# Patient Record
Sex: Male | Born: 1985 | Race: White | Hispanic: No | Marital: Married | Smoking: Former smoker
Health system: Southern US, Community
[De-identification: ages and names within clinical notes are randomized; demographics above are authoritative.]

## PROBLEM LIST (undated history)

## (undated) ENCOUNTER — Emergency Department (HOSPITAL_COMMUNITY): Admission: EM | Discharge status: Absent without leave | Payer: Managed Care, Other (non HMO)

## (undated) DIAGNOSIS — F419 Anxiety disorder, unspecified: Secondary | ICD-10-CM

## (undated) HISTORY — PX: NECK SURGERY: SHX720

---
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
## 2011-04-06 ENCOUNTER — Emergency Department: Payer: Self-pay | Admitting: Unknown Physician Specialty

## 2011-05-29 ENCOUNTER — Emergency Department: Payer: Self-pay | Admitting: Emergency Medicine

## 2012-02-11 ENCOUNTER — Emergency Department: Payer: Self-pay | Admitting: Emergency Medicine

## 2013-09-27 ENCOUNTER — Encounter (HOSPITAL_COMMUNITY): Payer: Self-pay | Admitting: Emergency Medicine

## 2013-09-27 ENCOUNTER — Emergency Department (HOSPITAL_COMMUNITY): Payer: Managed Care, Other (non HMO)

## 2013-09-27 ENCOUNTER — Emergency Department (HOSPITAL_COMMUNITY)
Admission: EM | Admit: 2013-09-27 | Discharge: 2013-09-27 | Disposition: A | Discharge status: Home / Self Care | Payer: Managed Care, Other (non HMO) | Attending: Emergency Medicine | Admitting: Emergency Medicine

## 2013-09-27 DIAGNOSIS — M549 Dorsalgia, unspecified: Secondary | ICD-10-CM | POA: Insufficient documentation

## 2013-09-27 DIAGNOSIS — Z87891 Personal history of nicotine dependence: Secondary | ICD-10-CM | POA: Diagnosis not present

## 2013-09-27 DIAGNOSIS — R110 Nausea: Secondary | ICD-10-CM | POA: Insufficient documentation

## 2013-09-27 DIAGNOSIS — M545 Low back pain, unspecified: Secondary | ICD-10-CM | POA: Diagnosis not present

## 2013-09-27 MED ORDER — KETOROLAC TROMETHAMINE 60 MG/2ML IM SOLN
60.0000 mg | Freq: Once | INTRAMUSCULAR | Status: AC
  Administered 2013-09-27: 60 mg via INTRAMUSCULAR
  Filled 2013-09-27: qty 2

## 2013-09-27 NOTE — ED Provider Notes (Signed)
CSN: [id number]     Arrival date & time 09/27/13  0703 History   First MD Initiated Contact with Patient 09/27/13 0710     Chief Complaint  Patient presents with  . Back Pain  . Nausea     (Consider location/radiation/quality/duration/timing/severity/associated sxs/prior Treatment) HPI  Chad Patton is a 28 y.o. male who developed low back pain. Last night without known trauma. The pain was worse this morning, so he could not go to work. He is able to walk. The pain is worse when getting up to move. He has a feeling of "feeling hot all over". He denies fever, chills, nausea, or vomiting loss of bowel or bladder continence. He denies prior problems like this. He works Holiday representative. There are no other known modifying factors.   History reviewed. No pertinent past medical history. History reviewed. No pertinent past surgical history. No family history on file. History  Substance Use Topics  . Smoking status: Former Games developer  . Smokeless tobacco: Not on file  . Alcohol Use: No    Review of Systems  All other systems reviewed and are negative.     Allergies  Review of patient's allergies indicates no known allergies.  Home Medications   Prior to Admission medications   Not on File   BP 130/72  Pulse 76  Temp(Src) 98.4 F (36.9 C) (Oral)  Resp 20  SpO2 99% Physical Exam  Nursing note and vitals reviewed. Constitutional: He is oriented to person, place, and time. He appears well-developed and well-nourished.  HENT:  Head: Normocephalic and atraumatic.  Right Ear: External ear normal.  Left Ear: External ear normal.  Eyes: Conjunctivae and EOM are normal. Pupils are equal, round, and reactive to light.  Neck: Normal range of motion and phonation normal. Neck supple.  Cardiovascular: Normal rate, regular rhythm, normal heart sounds and intact distal pulses.   Pulmonary/Chest: Effort normal and breath sounds normal. He exhibits no bony tenderness.  Abdominal: Soft. There  is no tenderness.  Musculoskeletal: Normal range of motion.  Midline lumbar tenderness, without deformity or step-off. Mild, right lateral lumbar tenderness, without swelling or deformity. Normal range of motion back. Normal gait, demonstrated .  Neurological: He is alert and oriented to person, place, and time. No cranial nerve deficit or sensory deficit. He exhibits normal muscle tone. Coordination normal.  Skin: Skin is warm, dry and intact.  Psychiatric: He has a normal mood and affect. His behavior is normal. Judgment and thought content normal.    ED Course  Procedures (including critical care time)  Medications  ketorolac (TORADOL) injection 60 mg (not administered)    Patient Vitals for the past 24 hrs:  BP Temp Temp src Pulse Resp SpO2  09/27/13 0713 130/72 mmHg 98.4 F (36.9 C) Oral 76 20 99 %    9:44 AM Reevaluation with update and discussion. After initial assessment and treatment, an updated evaluation reveals more comfortable now. Chad Patton    Labs Review Labs Reviewed - No data to display  Imaging Review Dg Lumbar Spine Complete  09/27/2013   CLINICAL DATA:  Low back pain  EXAM: LUMBAR SPINE - COMPLETE 4+ VIEW  COMPARISON:  None.  FINDINGS: There is no evidence of lumbar spine fracture. Alignment is normal. Intervertebral disc spaces are maintained.  IMPRESSION: Negative.   Electronically Signed   By: Marlan Palau M.D.   On: 09/27/2013 08:23     EKG Interpretation None      MDM   Final diagnoses:  Bilateral low  back pain without sciatica   LBP likely related to type of work, he is doing. I doubt fracture, radiculopathy, myelopathy.   Nursing Notes Reviewed/ Care Coordinated Applicable Imaging Reviewed Interpretation of Laboratory Data incorporated into ED treatment  The patient appears reasonably screened and/or stabilized for discharge and I doubt any other medical condition or other [facility] requiring further screening, evaluation, or treatment in  the ED at this time prior to discharge.  Plan: Home Medications- IBU; Home Treatments- ice/heat, exercises, work release for 3 days then light duty for 5 days; return here if the recommended treatment, does not improve the symptoms; Recommended follow up- PCP prn  Flint Melter, MD 09/27/13 [phone]

## 2013-09-27 NOTE — ED Notes (Signed)
Pt states that she began having lower back pain that started last night and has worsened. Pt states that when he sits he feels lightheaded, nauseated, hot and his back starts "throbbing". Pt denies any urinary symptoms or injury to back.

## 2013-09-27 NOTE — Discharge Instructions (Signed)
Use ice to the back for 2 days, and to use heat. As you enter the heat phase, do some gentle stretching exercises. Avoid heavy lifting, for one week.   Back Pain, Adult Low back pain is very common. About 1 in 5 people have back pain.The cause of low back pain is rarely dangerous. The pain often gets better over time.About half of people with a sudden onset of back pain feel better in just 2 weeks. About 8 in 10 people feel better by 6 weeks.  CAUSES Some common causes of back pain include:  Strain of the muscles or ligaments supporting the spine.  Wear and tear (degeneration) of the spinal discs.  Arthritis.  Direct injury to the back. DIAGNOSIS Most of the time, the direct cause of low back pain is not known.However, back pain can be treated effectively even when the exact cause of the pain is unknown.Answering your caregiver's questions about your overall health and symptoms is one of the most accurate ways to make sure the cause of your pain is not dangerous. If your caregiver needs more information, he or she may order lab work or imaging tests (X-rays or MRIs).However, even if imaging tests show changes in your back, this usually does not require surgery. HOME CARE INSTRUCTIONS For many people, back pain returns.Since low back pain is rarely dangerous, it is often a condition that people can learn to [facility] their own.   Remain active. It is stressful on the back to sit or stand in one place. Do not sit, drive, or stand in one place for more than 30 minutes at a time. Take short walks on level surfaces as soon as pain allows.Try to increase the length of time you walk each day.  Do not stay in bed.Resting more than 1 or 2 days can delay your recovery.  Do not avoid exercise or work.Your body is made to move.It is not dangerous to be active, even though your back may hurt.Your back will likely heal faster if you return to being active before your pain is gone.  Pay  attention to your body when you bend and lift. Many people have less discomfortwhen lifting if they bend their knees, keep the load close to their bodies,and avoid twisting. Often, the most comfortable positions are those that put less stress on your recovering back.  Find a comfortable position to sleep. Use a firm mattress and lie on your side with your knees slightly bent. If you lie on your back, put a pillow under your knees.  Only take over-the-counter or prescription medicines as directed by your caregiver. Over-the-counter medicines to reduce pain and inflammation are often the most helpful.Your caregiver may prescribe muscle relaxant drugs.These medicines help dull your pain so you can more quickly return to your normal activities and healthy exercise.  Put ice on the injured area.  Put ice in a plastic bag.  Place a towel between your skin and the bag.  Leave the ice on for 15-20 minutes, 03-04 times a day for the first 2 to 3 days. After that, ice and heat may be alternated to reduce pain and spasms.  Ask your caregiver about trying back exercises and gentle massage. This may be of some benefit.  Avoid feeling anxious or stressed.Stress increases muscle tension and can worsen back pain.It is important to recognize when you are anxious or stressed and learn ways to manage it.Exercise is a great option. SEEK MEDICAL CARE IF:  You have pain that  is not relieved with rest or medicine.  You have pain that does not improve in 1 week.  You have new symptoms.  You are generally not feeling well. SEEK IMMEDIATE MEDICAL CARE IF:   You have pain that radiates from your back into your legs.  You develop new bowel or bladder control problems.  You have unusual weakness or numbness in your arms or legs.  You develop nausea or vomiting.  You develop abdominal pain.  You feel faint. Document Released: 01/11/2005 Document Revised: 07/13/2011 Document Reviewed:  05/15/2013 [facility] Patient Information 2015 [place], [place]. This information is not intended to replace advice given to you by your health care provider. Make sure you discuss any questions you have with your health care provider.  Back Exercises Back exercises help treat and prevent back injuries. The goal of back exercises is to increase the strength of your abdominal and back muscles and the flexibility of your back. These exercises should be started when you no longer have back pain. Back exercises include:  Pelvic Tilt. Lie on your back with your knees bent. Tilt your pelvis until the lower part of your back is against the floor. Hold this position 5 to 10 sec and repeat 5 to 10 times.  Knee to Chest. Pull first 1 knee up against your chest and hold for 20 to 30 seconds, repeat this with the other knee, and then both knees. This may be done with the other leg straight or bent, whichever feels better.  Sit-Ups or Curl-Ups. Bend your knees 90 degrees. Start with tilting your pelvis, and do a partial, slow sit-up, lifting your trunk only 30 to 45 degrees off the floor. Take at least 2 to 3 seconds for each sit-up. Do not do sit-ups with your knees out straight. If partial sit-ups are difficult, simply do the above but with only tightening your abdominal muscles and holding it as directed.  Hip-Lift. Lie on your back with your knees flexed 90 degrees. Push down with your feet and shoulders as you raise your hips a couple inches off the floor; hold for 10 seconds, repeat 5 to 10 times.  Back arches. Lie on your stomach, propping yourself up on bent elbows. Slowly press on your hands, causing an arch in your low back. Repeat 3 to 5 times. Any initial stiffness and discomfort should lessen with repetition over time.  Shoulder-Lifts. Lie face down with arms beside your body. Keep hips and torso pressed to floor as you slowly lift your head and shoulders off the floor. Do not overdo your exercises,  especially in the beginning. Exercises may cause you some mild back discomfort which lasts for a few minutes; however, if the pain is more severe, or lasts for more than 15 minutes, do not continue exercises until you see your caregiver. Improvement with exercise therapy for back problems is slow.  See your caregivers for assistance with developing a proper back exercise program. Document Released: 02/19/2004 Document Revised: 04/05/2011 Document Reviewed: 11/12/2010 [facility] Patient Information 2015 [place], [place]. This information is not intended to replace advice given to you by your health care provider. Make sure you discuss any questions you have with your health care provider.

## 2013-09-27 NOTE — ED Notes (Signed)
Pt given crackers and drink 

## 2013-09-27 NOTE — ED Notes (Addendum)
MD at bedside. 

## 2014-02-13 ENCOUNTER — Encounter (HOSPITAL_COMMUNITY): Payer: Self-pay | Admitting: Emergency Medicine

## 2014-02-13 ENCOUNTER — Emergency Department (HOSPITAL_COMMUNITY)
Admission: EM | Admit: 2014-02-13 | Discharge: 2014-02-13 | Disposition: A | Discharge status: Home / Self Care | Payer: Managed Care, Other (non HMO) | Attending: Emergency Medicine | Admitting: Emergency Medicine

## 2014-02-13 DIAGNOSIS — K029 Dental caries, unspecified: Secondary | ICD-10-CM | POA: Diagnosis not present

## 2014-02-13 DIAGNOSIS — Z87891 Personal history of nicotine dependence: Secondary | ICD-10-CM | POA: Diagnosis not present

## 2014-02-13 DIAGNOSIS — K002 Abnormalities of size and form of teeth: Secondary | ICD-10-CM | POA: Diagnosis not present

## 2014-02-13 DIAGNOSIS — K088 Other specified disorders of teeth and supporting structures: Secondary | ICD-10-CM | POA: Insufficient documentation

## 2014-02-13 DIAGNOSIS — K0889 Other specified disorders of teeth and supporting structures: Secondary | ICD-10-CM

## 2014-02-13 MED ORDER — BUPIVACAINE-EPINEPHRINE (PF) 0.5% -1:200000 IJ SOLN
1.8000 mL | Freq: Once | INTRAMUSCULAR | Status: AC
  Administered 2014-02-13: 1.8 mL

## 2014-02-13 MED ORDER — OXYCODONE HCL 5 MG PO TABS
5.0000 mg | ORAL_TABLET | Freq: Once | ORAL | Status: AC
  Administered 2014-02-13: 5 mg via ORAL
  Filled 2014-02-13: qty 1

## 2014-02-13 MED ORDER — OXYCODONE-ACETAMINOPHEN 5-325 MG PO TABS: 2.0000 | ORAL_TABLET | ORAL | Status: DC | PRN

## 2014-02-13 MED ORDER — PENICILLIN V POTASSIUM 500 MG PO TABS: 500.0000 mg | ORAL_TABLET | Freq: Three times a day (TID) | ORAL | Status: DC

## 2014-02-13 NOTE — ED Provider Notes (Signed)
CSN: [id number]     Arrival date & time 02/13/14  2154 History  This chart was scribed for non-physician practitioner, Terri Piedra, PA-C,working with Raeford Razor, MD, by Karle Plumber, ED Scribe. This patient was seen in room TR06C/TR06C and the patient's care was started at 10:17 PM.  Chief Complaint  Patient presents with  . Dental Pain   The history is provided by the patient. No language interpreter was used.    HPI Comments:  Chad Patton is a 29 y.o. male who presents to the Emergency Department complaining of severe dental pain of the lower right-sided first molar that has been ongoing for the past two weeks. He states he saw his dentist two weeks ago and was given a prescription for Vicodin 5/500 mg (last dose about two hours ago) but he is now out. He denies being prescribed an antibiotic. Pt states he is supposed to have the tooth worked on but as not yet made the appt. Cold air and eating makes the pain worse. Denies alleviating factors. Denies fever, chills, nausea, vomiting, difficulty swallowing or opening the mouth, SOB or drooling. He has no known drug allergies. Past surgical h/o cervical spine surgery.  History reviewed. No pertinent past medical history. Past Surgical History  Procedure Laterality Date  . Neck surgery     No family history on file. History  Substance Use Topics  . Smoking status: Former Games developer  . Smokeless tobacco: Not on file  . Alcohol Use: No    Review of Systems  Constitutional: Negative for fever and chills.  HENT: Positive for dental problem. Negative for drooling and trouble swallowing.   Respiratory: Negative for shortness of breath.   Gastrointestinal: Negative for nausea and vomiting.  All other systems reviewed and are negative.   Allergies  Review of patient's allergies indicates no known allergies.  Home Medications   Prior to Admission medications   Medication Sig Start Date End Date Taking? Authorizing Provider   acetaminophen (TYLENOL) 325 MG tablet Take 650 mg by mouth every 6 (six) hours as needed for mild pain.    Historical Provider, MD  ibuprofen (ADVIL,MOTRIN) 200 MG tablet Take 400-600 mg by mouth every 6 (six) hours as needed for moderate pain.    Historical Provider, MD  oxyCODONE-acetaminophen (PERCOCET) 5-325 MG per tablet Take 2 tablets by mouth every 4 (four) hours as needed. 02/13/14   Shrika Milos A Forcucci, PA-C  penicillin v potassium (VEETID) 500 MG tablet Take 1 tablet (500 mg total) by mouth 3 (three) times daily. 02/13/14   Nkosi Cortright A Forcucci, PA-C   Triage Vitals: BP 138/91 mmHg  Pulse 68  Temp(Src) 97.6 F (36.4 C) (Oral)  Resp 16  SpO2 98% Physical Exam  Constitutional: He is oriented to person, place, and time. He appears well-developed and well-nourished.  HENT:  Head: Normocephalic and atraumatic.  Right Ear: Tympanic membrane, external ear and ear canal normal.  Left Ear: Tympanic membrane, external ear and ear canal normal.  Mouth/Throat: Uvula is midline, oropharynx is clear and moist and mucous membranes are normal. No oral lesions. No trismus in the jaw. Abnormal dentition. Dental caries present. No dental abscesses, uvula swelling or lacerations.    Eyes: EOM are normal.  Neck: Normal range of motion.  Cardiovascular: Normal rate.   Pulmonary/Chest: Effort normal.  Musculoskeletal: Normal range of motion.  Neurological: He is alert and oriented to person, place, and time.  Skin: Skin is warm and dry.  Psychiatric: He has a normal mood and  affect. His behavior is normal.  Nursing note and vitals reviewed.   ED Course  Dental Date/Time: 02/13/2014 10:46 PM Performed by: Terri PiedraFORCUCCI, Eliot Bencivenga A Authorized by: Terri PiedraFORCUCCI, Keshawna Dix A Consent: Verbal consent obtained. Risks and benefits: risks, benefits and alternatives were discussed Consent given by: patient Patient understanding: patient states understanding of the procedure being performed Patient consent: the  patient's understanding of the procedure matches consent given Procedure consent: procedure consent matches procedure scheduled Relevant documents: relevant documents present and verified Test results: test results available and properly labeled Site marked: the operative site was marked Imaging studies: imaging studies available Patient identity confirmed: verbally with patient Time out: Immediately prior to procedure a "time out" was called to verify the correct patient, procedure, equipment, support staff and site/side marked as required. Local anesthesia used: yes Anesthesia: local infiltration Local anesthetic: bupivacaine 0.5% with epinephrine Anesthetic total: 1.8 ml Patient sedated: no Patient tolerance: Patient tolerated the procedure well with no immediate complications   (including critical care time) DIAGNOSTIC STUDIES: Oxygen Saturation is 98% on RA, normal by my interpretation.   COORDINATION OF CARE: 10:24 PM- Will give pt dental block and prescribe antibiotic. Advised pt to follow up with his dentist in the morning. Pt verbalizes understanding and agrees to plan.  Medications  bupivacaine-epinephrine (MARCAINE W/ EPI) 0.5% -1:200000 injection 1.8 mL (1.8 mLs Infiltration Given by Other 02/13/14 2243)  oxyCODONE (Oxy IR/ROXICODONE) immediate release tablet 5 mg (5 mg Oral Given 02/13/14 2243)   Labs Review Labs Reviewed - No data to display  Imaging Review No results found.   EKG Interpretation None         MDM   Final diagnoses:  Pain, dental   A 29 year old male presents emergency room for evaluation of dental pain. Patient has Artie seen a dentist and is to have a plaque scaling and likely to have the tooth pulled. Patient is to call dentist in the morning. Patient given dental block here. There is no evidence for abscess, trismus, or Ludwig's angina at this time. We'll discharge home with Pen-Vee and we'll give short course of oxycodone. Patient follow-up  with dentist tomorrow. Patient to return for symptoms of Ludwig's angina or abscess. He states understanding and agreement at this time. Patient stable for discharge.  I personally performed the services described in this documentation, which was scribed in my presence. The recorded information has been reviewed and is accurate.    Eben Burowourtney A Forcucci, PA-C 02/13/14 2250  Raeford RazorStephen Kohut, MD 02/14/14 0000

## 2014-02-13 NOTE — ED Notes (Signed)
Pt arrives with dental pain x2 weeks, states he's been to the dentist and was given vicodin but ran out. Cannot recall if dentist states to get it pulled/root canal, states he needs something to get him through the night. No visible abcess.

## 2014-02-13 NOTE — Discharge Instructions (Signed)
Dental Pain °A tooth ache may be caused by cavities (tooth decay). Cavities expose the nerve of the tooth to air and hot or cold temperatures. It may come from an infection or abscess (also called a boil or furuncle) around your tooth. It is also often caused by dental caries (tooth decay). This causes the pain you are having. °DIAGNOSIS  °Your caregiver can diagnose this problem by exam. °TREATMENT  °· If caused by an infection, it may be treated with medications which kill germs (antibiotics) and pain medications as prescribed by your caregiver. Take medications as directed. °· Only take over-the-counter or prescription medicines for pain, discomfort, or fever as directed by your caregiver. °· Whether the tooth ache today is caused by infection or dental disease, you should see your dentist as soon as possible for further care. °SEEK MEDICAL CARE IF: °The exam and treatment you received today has been provided on an emergency basis only. This is not a substitute for complete medical or dental care. If your problem worsens or new problems (symptoms) appear, and you are unable to meet with your dentist, call or return to this location. °SEEK IMMEDIATE MEDICAL CARE IF:  °· You have a fever. °· You develop redness and swelling of your face, jaw, or neck. °· You are unable to open your mouth. °· You have severe pain uncontrolled by pain medicine. °MAKE SURE YOU:  °· Understand these instructions. °· Will watch your condition. °· Will get help right away if you are not doing well or get worse. °Document Released: 01/11/2005 Document Revised: 04/05/2011 Document Reviewed: 08/30/2007 °ExitCare® Patient Information ©2015 [employer]. This information is not intended to replace advice given to you by your health care provider. Make sure you discuss any questions you have with your health care provider. ° °Emergency Department Resource Guide °1) Find a Doctor and Pay Out of Pocket °Although you won't have to find out who  is covered by your insurance plan, it is a good idea to ask around and get recommendations. You will then need to call the office and see if the doctor you have chosen will accept you as a new patient and what types of options they offer for patients who are self-pay. Some doctors offer discounts or will set up payment plans for their patients who do not have insurance, but you will need to ask so you aren't surprised when you get to your appointment. ° °2) Contact Your Local Health Department °Not all health departments have doctors that can see patients for sick visits, but many do, so it is worth a call to see if yours does. If you don't know where your local health department is, you can check in your phone book. The CDC also has a tool to help you locate your state's health department, and many state websites also have listings of all of their local health departments. ° °3) Find a Walk-in Clinic °If your illness is not likely to be very severe or complicated, you may want to try a walk in clinic. These are popping up all over the country in pharmacies, drugstores, and shopping centers. They're usually staffed by nurse practitioners or physician assistants that have been trained to treat common illnesses and complaints. They're usually fairly quick and inexpensive. However, if you have serious medical issues or chronic medical problems, these are probably not your best option. ° °No Primary Care Doctor: °- Call [facility] at  832-8000 - they can help you locate a primary   care doctor that  accepts your insurance, provides certain services, etc. °- Physician Referral Service- 1-800-533-3463 ° °Chronic Pain Problems: °Organization         Address  Phone   Notes  °[facility]  (336) 297-2271 Patients need to be referred by their primary care doctor.  ° °Medication Assistance: °Organization         Address  Phone   Notes  °[place] [facility] [address]  [address] °[place], [place] [address] (336) 641-8030 --Must be a resident of [place] °-- Must have NO insurance coverage whatsoever (no Medicaid/ Medicare, etc.) °-- The pt. MUST have a primary care doctor that directs their care regularly and follows them in the community °  °[facility]  (866) 331-1348   °[employer]  (888) 892-1162   ° °Agencies that provide inexpensive medical care: °Organization         Address  Phone   Notes  °[facility]  (336) 832-8035   °[facility]    (336) 832-7272   °[facility] [address] °[place], [place] [address] (336) 832-4777   °[facility] [address], °[place] (336) 271-4999   °[facility]    (336) 373-0678   °[facility]    (336) 272-1050   °[facility] ° [address], [place] Phone:  (336) 832-4444, Fax:  (336) 832-4440 Hours of Operation:  9 am - 6 pm, M-F.  Also accepts Medicaid/Medicare and self-pay.  °[facility] ° [address], [place] Phone: (336) 832-3150, Fax: (336) 832-3151. Hours of Operation:  8:30 am - 5:30 pm, M-F.  Also accepts Medicaid and self-pay.  °[facility] [place] [address], [place] Phone: (336) 878-6027   °[facility] [address], [place], [place] (336)723-1848, Ext. 123 Mondays & Thursdays: 7-9 AM.  First 15 patients are seen on a first come, first serve basis. °  ° °Medicaid-accepting [place] Providers: ° °Organization         Address  Phone   Notes  °[facility] [address], [place] (336) 641-2100 Also accepts self-pay patients.  °[facility] [address], [place] ° (336) 856-9996   °[facility] [address], [place] (336) 288-8857   °[facility] [address] [place] [address], [place] (336) 299-7000   °Veita Bland [address], [place]  ° (336) 373-1557 Only accepts [place] Access Medicaid patients after they have their name applied to their card.  ° °Self-Pay (no insurance) in [place]: ° °Organization         Address  Phone   Notes  °Sickle Cell Patients, [facility] [address], [place] (336) 832-1970   °[facility] [address], [place] (336) 832-4400   °[facility] ° [address], [place] (336) 992-4800   °[facility]/Dr. Osei-Bonsu ° [address] [place] [address], [place] or [address], [place] (336) 841-8500 Phone number for both [place] and [place] locations is the same.  °[facility] [address], [place] (336) 299-0000   °[facility] [address] [place] [address],  or [address] (336) 852-7530 °(336) 878-2260   °  [facility] [address], [place] (336) 350-1642, phone; (336) 294-5005, fax Sees patients 1st and 3rd Saturday of every month.  Must not qualify for public or private insurance (i.e. Medicaid, Medicare, [place] Health Choice, Veterans' Benefits) • Household income should be no more than 200% of the poverty level •The clinic cannot treat you if you are pregnant or think you are pregnant • Sexually transmitted diseases are not treated at the clinic.  ° ° °Dental Care: °Organization         Address  Phone  Notes  °[facility] [facility] [address], [place] (336) 641-6152 Accepts children up to age 21 who are enrolled in Medicaid or [place] Health Choice; pregnant women with a Medicaid card; and children who have applied for Medicaid or [place] Health Choice, but were declined, whose parents can pay a reduced fee at time of service.  °[facility] [place]  [address], [place] (336) 641-7733 Accepts children up to age 21 who are  enrolled in Medicaid or [place] Health Choice; pregnant women with a Medicaid card; and children who have applied for Medicaid or [place] Health Choice, but were declined, whose parents can pay a reduced fee at time of service.  °[facility] ° [address], [place] (336) 641-4533 Patients are seen by appointment only. Walk-ins are not accepted. [facility] will see patients 18 years of age and older. °Monday - Tuesday (8am-5pm) °Most Wednesdays (8:30-5pm) °$30 per visit, cash only  °[facility] ° [address], [place] (336) 641-4533 Patients are seen by appointment only. Walk-ins are not accepted. [facility] will see patients 18 years of age and older. °One Wednesday Evening (Monthly: Volunteer Based).  $30 per visit, cash only  °[facility]  (919) 537-3737 for adults; Children under age 4, call Graduate Pediatric Dentistry at (919) [phone]. Children aged 4-14, please call (919) [phone] to request a pediatric application. ° Dental services are provided in all areas of dental care including fillings, crowns and bridges, complete and partial dentures, implants, gum treatment, root canals, and extractions. Preventive care is also provided. Treatment is provided to both adults and children. °Patients are selected via a lottery and there is often a waiting list. °  °[facility] [address], ° ° (336) 763-8833 [email or web address] °  °[facility] [address], [place], [place] (336)723-1848, Ext. 123 Second and Fourth Thursday of each month, opens at 6:30 AM; Clinic ends at 9 AM.  Patients are seen on a first-come first-served basis, and a limited number are seen during each clinic.  ° °[facility] ° [address], [place], [place] (336) 723-7904   Eligibility Requirements °You must have lived in [place], [place], or [place] counties for at least the last three months. °  You  cannot be eligible for state or federal sponsored healthcare insurance, including Veterans Administration, Medicaid, or Medicare. °  You generally cannot be eligible for healthcare insurance through your employer.  °  How to apply: °Eligibility screenings are held every Tuesday and Wednesday afternoon from 1:00 pm until 4:00 pm. You do not need an appointment for the interview!  °[facility] [address], [place], [place] 336-631-2330   °[facility] [facility]  336-342-8273   °[facility]  336-703-3100   °[facility]  336-570-6415   ° °Behavioral Health Resources in the Community: °Intensive Outpatient Programs °Organization         Address  Phone  Notes  °[facility] [address], [place], [place] 336-878-6098   °[facility] [address], [place], [place] 336-832-9800   °[facility] [address], [place], [place] ° 336-882-2125   °[facility] [address],  °[place], [place] 1-800-853-5163 or 336-641-4981   °Substance Abuse Resources °Organization         Address  Phone  Notes  °[facility]  336-882-2125   °[facility]  336-784-9470   °[facility]  336-285-9073   °[facility]  336-845-3988   °Residential & Outpatient Substance Abuse Program  1-800-659-3381   °Psychological Services °Organization         Address  Phone  Notes  °[facility]  336- 832-9600   °[facility]  336- 378-7881   °[facility] [address], [place] 1-800-853-5163 or 336-641-4981   ° °Mobile Crisis Teams °Organization         Address  Phone  Notes  °[facility], Mobile Crisis Care Unit  1-877-626-1772   °Assertive °[facility] ° [address] [place], [place] 336-834-9664   °Sharon DeEsch [address] °[place] 336-554-5454   ° °Self-Help/Support  Groups °Organization         Address  Phone             Notes  °[facility] - variety of support groups  336- 373-1402 Call for more information  °Narcotics Anonymous (NA), [facility] [address], °[place]  2 meetings at this location  ° °Residential Treatment Programs °Organization         Address  Phone  Notes  °[facility] [address],    °[place]  1-866-801-8205   °[facility] ° [address], [place], [place] 704-293-8524   °[facility] [address], [place] 336-845-3988 Admissions: 8am-3pm M-F  °[facility] [address]    °[place], [place] 336-841-1104   °[facility] [address], [place], [place] 336-379-7146   °[facility] [address]  °[place], [place] 336-285-9073   °[facility] - Intensive Outpatient [address], [place], [place] 336-852-3033   °[facility] [address]  °[place], [place] 1-877-615-2722 or 336-784-9470   °[facility] [address] [place], [place] 336-227-7417 Accepts Medicaid  °[facility] [address]  ° [place] 1-800-659-3381 Substance Abuse/Addiction Treatment  ° °[place] Behavioral Health Resources °Organization         Address  Phone  Notes  °[facility]  (888) 581-9988   °Julie Brannon, PhD [address] [place], [place]   (336) 349-5553 or (336) 951-0000   °[facility]   [address] °[place], [place] (336) 349-4454   °[facility] [address], [place], [place] (336) 342-8316 Insurance/Medicaid/sponsorship through [facility]  °[facility] [address]                                    [place],  (336) 342-8316 Therapy/tele-psych/case  °[facility]   [address]  ° [place], [place] (336) 349-2233    °Dr. Arfeen  (336) 349-4544   °[facility] [place]  [employer] [place] [facility] 1) [address], [place] °2) [address], [place] °3)  [address], [place] (336) 349-3220 °(336) 342-7768 ° °(336) 342-8140   °[place] Child Abuse [facility] (336) 342-1394 or (336) 342-3537 (After Hours)    ° ° ° °

## 2014-02-13 NOTE — ED Notes (Signed)
Pt. reports right lower molar pain for 2 weeks .

## 2014-07-09 ENCOUNTER — Other Ambulatory Visit: Payer: Self-pay | Admitting: Surgery

## 2014-07-09 NOTE — H&P (Signed)
Chad Patton 07/09/2014 10:02 AM Location: [facility] Patient #: [id number] DOB: 10/07/1985 Married / Language: [place] / Race: White Male History of Present Illness Ardeth Sportsman MD; 07/09/2014 7:01 PM) The patient is a 29 year old male who presents with an incisional hernia. Patient sent by [facility] physician assistant Ma Hillock for concern of bulging, probable hernia.  Patient felt sharp pain in RIGHT lower quadrant hernia. 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 Concerned that his become more constipated. Comes today with his wife. Not large bulge but sometimes worse with activity. No fevers or chills. Nephrology urination. No prior abdominal hernia surgery. Very active. Has not smoked in many years.  Physical Exam Ardeth Sportsman MD; 07/09/2014 11:03 AM)  General Mental Status-Alert. General Appearance-Not in acute distress, Not Sickly. Orientation-Oriented X3. Hydration-Well hydrated. Voice-Normal.  Integumentary Global Assessment Upon inspection and palpation of skin surfaces of the - Axillae: non-tender, no inflammation or ulceration, no drainage. and Distribution of scalp and body hair is normal. General Characteristics Temperature - normal warmth is noted.  Head and Neck Head-normocephalic, atraumatic with no lesions or palpable masses. Face Global Assessment - atraumatic, no absence of expression. Neck Global Assessment - no abnormal movements, no bruit auscultated on the right, no bruit auscultated on the left, no decreased range of motion, non-tender. Trachea-midline. Thyroid Gland Characteristics - non-tender.  Eye Eyeball - Left-Extraocular movements intact, No Nystagmus. Eyeball - Right-Extraocular movements intact, No Nystagmus. Cornea - Left-No Hazy. Cornea - Right-No Hazy. Sclera/Conjunctiva - Left-No scleral icterus, No Discharge. Sclera/Conjunctiva - Right-No scleral icterus, No Discharge. Pupil - Left-Direct  reaction to light normal. Pupil - Right-Direct reaction to light normal.  ENMT Ears Pinna - Left - no drainage observed, no generalized tenderness observed. Right - no drainage observed, no generalized tenderness observed. Nose and Sinuses External Inspection of the Nose - no destructive lesion observed. Inspection of the nares - Left - quiet respiration. Right - quiet respiration. Mouth and Throat Lips - Upper Lip - no fissures observed, no pallor noted. Lower Lip - no fissures observed, no pallor noted. Nasopharynx - no discharge present. Oral Cavity/Oropharynx - Tongue - no dryness observed. Oral Mucosa - no cyanosis observed. Hypopharynx - no evidence of airway distress observed.  Chest and Lung Exam Inspection Movements - Normal and Symmetrical. Accessory muscles - No use of accessory muscles in breathing. Palpation Palpation of the chest reveals - Non-tender. Auscultation Breath sounds - Normal and Clear.  Cardiovascular Auscultation Rhythm - Regular. Murmurs & Other Heart Sounds - Auscultation of the heart reveals - No Murmurs and No Systolic Clicks.  Abdomen Inspection Inspection of the abdomen reveals - No Visible peristalsis and No Abnormal pulsations. Umbilicus - No Bleeding, No Urine drainage. Palpation/Percussion Palpation and Percussion of the abdomen reveal - Soft, Non Tender, No Rebound tenderness, No Rigidity (guarding) and No Cutaneous hyperesthesia. Note: Soft and flat. Very sensitive umbilical hernia.   Male Genitourinary Sexual Maturity Tanner 5 - Adult hair pattern and Adult penile size and shape. Note: Normal external male genitalia. Circumcised. Testes and cords normal. Obvious RIGHT groin bulge consistent with inguinal hernia. Reducible. Subtle impulse on exam but no definite LEFT inguinal hernia.   Peripheral Vascular Upper Extremity Inspection - Left - No Cyanotic nailbeds, Not Ischemic. Right - No Cyanotic nailbeds, Not  Ischemic.  Neurologic Neurologic evaluation reveals -normal attention span and ability to concentrate, able to name objects and repeat phrases. Appropriate fund of knowledge , normal sensation and normal coordination. Mental Status Affect - not angry, not  paranoid. Cranial Nerves-Normal Bilaterally. Gait-Normal.  Neuropsychiatric Mental status exam performed with findings of-able to articulate well with normal speech/language, rate, volume and coherence, thought content normal with ability to perform basic computations and apply abstract reasoning and no evidence of hallucinations, delusions, obsessions or homicidal/suicidal ideation.  Musculoskeletal Global Assessment Spine, Ribs and Pelvis - no instability, subluxation or laxity. Right Upper Extremity - no instability, subluxation or laxity.  Lymphatic Head & Neck  General Head & Neck Lymphatics: Bilateral - Description - No Localized lymphadenopathy. Axillary  General Axillary Region: Bilateral - Description - No Localized lymphadenopathy. Femoral & Inguinal  Generalized Femoral & Inguinal Lymphatics: Left - Description - No Localized lymphadenopathy. Right - Description - No Localized lymphadenopathy.    Assessment & Plan Ardeth Sportsman(Karely Hurtado C. Tessie Ordaz MD; 07/09/2014 11:04 AM)  RIGHT INGUINAL HERNIA (550.90  K40.90) Impression: Obvious RIGHT inguinal hernia. Possible LEFT as well. He would benefit from surgical repair, especially given his moderately intense physical activity. Low threshold to fix LEFT side if seen. Reasonable laparoscopic approach. Patient and his wife are interested in proceeding.  Current Plans Pt Education - Pamphlet Given - Laparoscopic Hernia Repair: discussed with patient and provided information. Pt Education - [facility] Hernia Post-Op HCI (Neena Beecham): discussed with patient and provided information. Pt Education - [facility] Good Bowel Health (Casi Westerfeld) Pt Education - [facility] Pain Control (Caydn Justen) Schedule for Surgery Written  instructions provided Discussed regular exercise with patient.  Ardeth SportsmanSteven C. Mallissa Lorenzen, M.D., F.A.C.S. Gastrointestinal and Minimally Invasive Surgery [facility] [address] [place], [place] [id number] [phone] Main / Paging

## 2014-07-18 ENCOUNTER — Encounter (HOSPITAL_COMMUNITY): Payer: Self-pay | Admitting: *Deleted

## 2014-07-18 ENCOUNTER — Emergency Department (HOSPITAL_COMMUNITY): Payer: Managed Care, Other (non HMO)

## 2014-07-18 ENCOUNTER — Emergency Department (HOSPITAL_COMMUNITY)
Admission: EM | Admit: 2014-07-18 | Discharge: 2014-07-18 | Disposition: A | Discharge status: Home / Self Care | Payer: Managed Care, Other (non HMO) | Attending: Emergency Medicine | Admitting: Emergency Medicine

## 2014-07-18 DIAGNOSIS — Z87891 Personal history of nicotine dependence: Secondary | ICD-10-CM | POA: Insufficient documentation

## 2014-07-18 DIAGNOSIS — R1030 Lower abdominal pain, unspecified: Secondary | ICD-10-CM

## 2014-07-18 DIAGNOSIS — N508 Other specified disorders of male genital organs: Secondary | ICD-10-CM | POA: Diagnosis present

## 2014-07-18 DIAGNOSIS — IMO0002 Reserved for concepts with insufficient information to code with codable children: Secondary | ICD-10-CM

## 2014-07-18 LAB — BASIC METABOLIC PANEL
Anion gap: 8 (ref 5–15)
BUN: 13 mg/dL (ref 6–20)
CO2: 28 mmol/L (ref 22–32)
Calcium: 8.9 mg/dL (ref 8.9–10.3)
Chloride: 101 mmol/L (ref 101–111)
Creatinine, Ser: 1.1 mg/dL (ref 0.61–1.24)
GFR calc Af Amer: >60 mL/min (ref >60)
GFR calc non Af Amer: >60 mL/min (ref >60)
Glucose, Bld: 97 mg/dL (ref 65–99)
Potassium: 3.5 mmol/L (ref 3.5–5.1)
Sodium: 137 mmol/L (ref 135–145)

## 2014-07-18 LAB — CBC WITH DIFFERENTIAL/PLATELET
Basophils Absolute: 0 10*3/uL (ref 0.0–0.1)
Basophils Relative: 0 % (ref 0–1)
Eosinophils Absolute: 0.3 10*3/uL (ref 0.0–0.7)
Eosinophils Relative: 4 % (ref 0–5)
HCT: 41.4 % (ref 39.0–52.0)
Hemoglobin: 14.9 g/dL (ref 13.0–17.0)
Lymphocytes Relative: 40 % (ref 12–46)
Lymphs Abs: 3.1 10*3/uL (ref 0.7–4.0)
MCH: 31.6 pg (ref 26.0–34.0)
MCHC: 36 g/dL (ref 30.0–36.0)
MCV: 87.9 fL (ref 78.0–100.0)
Monocytes Absolute: 0.5 10*3/uL (ref 0.1–1.0)
Monocytes Relative: 6 % (ref 3–12)
Neutro Abs: 3.8 10*3/uL (ref 1.7–7.7)
Neutrophils Relative %: 50 % (ref 43–77)
Platelets: 198 10*3/uL (ref 150–400)
RBC: 4.71 MIL/uL (ref 4.22–5.81)
RDW: 12.1 % (ref 11.5–15.5)
WBC: 7.8 10*3/uL (ref 4.0–10.5)

## 2014-07-18 LAB — URINALYSIS, ROUTINE W REFLEX MICROSCOPIC
Bilirubin Urine: NEGATIVE
Glucose, UA: NEGATIVE mg/dL
Hgb urine dipstick: NEGATIVE
Ketones, ur: NEGATIVE mg/dL
Leukocytes, UA: NEGATIVE
Nitrite: NEGATIVE
Protein, ur: NEGATIVE mg/dL
Specific Gravity, Urine: 1.024 (ref 1.005–1.030)
Urobilinogen, UA: 1 mg/dL (ref 0.0–1.0)
pH: 7 (ref 5.0–8.0)

## 2014-07-18 MED ORDER — HYDROCODONE-ACETAMINOPHEN 5-325 MG PO TABS: 1.0000 | ORAL_TABLET | Freq: Four times a day (QID) | ORAL | Status: DC | PRN

## 2014-07-18 MED ORDER — IOHEXOL 300 MG/ML  SOLN
100.0000 mL | Freq: Once | INTRAMUSCULAR | Status: AC | PRN
  Administered 2014-07-18: 100 mL via INTRAVENOUS

## 2014-07-18 MED ORDER — OXYCODONE-ACETAMINOPHEN 5-325 MG PO TABS
ORAL_TABLET | ORAL | Status: AC
  Filled 2014-07-18: qty 1

## 2014-07-18 MED ORDER — OXYCODONE-ACETAMINOPHEN 5-325 MG PO TABS
1.0000 | ORAL_TABLET | Freq: Once | ORAL | Status: AC
  Administered 2014-07-18: 1 via ORAL

## 2014-07-18 MED ORDER — FENTANYL CITRATE (PF) 100 MCG/2ML IJ SOLN
100.0000 ug | Freq: Once | INTRAMUSCULAR | Status: AC
  Administered 2014-07-18: 100 ug via INTRAVENOUS
  Filled 2014-07-18: qty 2

## 2014-07-18 MED ORDER — IOHEXOL 300 MG/ML  SOLN
25.0000 mL | Freq: Once | INTRAMUSCULAR | Status: AC | PRN
  Administered 2014-07-18: 25 mL via ORAL

## 2014-07-18 MED ORDER — ONDANSETRON HCL 4 MG/2ML IJ SOLN
4.0000 mg | Freq: Once | INTRAMUSCULAR | Status: AC
  Administered 2014-07-18: 4 mg via INTRAVENOUS
  Filled 2014-07-18: qty 2

## 2014-07-18 NOTE — ED Notes (Signed)
Pt. Left with all belongings and refused wheelchair 

## 2014-07-18 NOTE — ED Notes (Signed)
Pt has hernia to umbilicus and right groin.  Pt reports he is having squeezing to right groin area.  Pt was sent here to be evaluated.

## 2014-07-18 NOTE — ED Notes (Signed)
No bowel movement in 2 days and he was told the on-call doctor is here and they might do the surgery today

## 2014-07-18 NOTE — ED Provider Notes (Signed)
CSN: [id number]     Arrival date & time 07/18/14  1410 History   First MD Initiated Contact with Patient 07/18/14 1857     Chief Complaint  Patient presents with  . Hernia     (Consider location/radiation/quality/duration/timing/severity/associated sxs/prior Treatment) HPI Patient presents to the emergency department with right scrotal pain.  The patient states he is complaining of right testicular pain.  He states that he has a possible hernia to that region.  The patient states that nothing seems make his condition, better or worse.  Palpation makes the pain worse and is testicle region.  The patient does not have chest pain, shortness of breath, nausea, vomiting, weakness, dizziness, headache, blurred vision, diarrhea, fever, lightheadedness, near syncope or syncope.  The patient states that he does have some lower pelvic pain.  The patient states that the pain started 2 days ago.  He states that he has had some constipation as well. History reviewed. No pertinent past medical history. Past Surgical History  Procedure Laterality Date  . Neck surgery     No family history on file. History  Substance Use Topics  . Smoking status: Former Games developer  . Smokeless tobacco: Not on file  . Alcohol Use: No    Review of Systems All other systems negative except as documented in the HPI. All pertinent positives and negatives as reviewed in the HPI.   Allergies  Review of patient's allergies indicates no known allergies.  Home Medications   Prior to Admission medications   Medication Sig Start Date End Date Taking? Authorizing Provider  acetaminophen (TYLENOL) 325 MG tablet Take 650 mg by mouth every 6 (six) hours as needed for mild pain.   Yes Historical Provider, MD  escitalopram (LEXAPRO) 10 MG tablet Take 10 mg by mouth daily as needed (anxiety).  07/01/14  Yes Historical Provider, MD  ibuprofen (ADVIL,MOTRIN) 200 MG tablet Take 400-600 mg by mouth every 6 (six) hours as needed for  moderate pain.   Yes Historical Provider, MD   BP 130/95 mmHg  Pulse 49  Temp(Src) 98.5 F (36.9 C) (Oral)  Resp 16  Ht  (1.905 m)  Wt 189 lb (85.73 kg)  BMI 23.62 kg/m2  SpO2 99% Physical Exam  Constitutional: He is oriented to person, place, and time. He appears well-developed and well-nourished. No distress.  HENT:  Head: Normocephalic and atraumatic.  Mouth/Throat: Oropharynx is clear and moist.  Eyes: Pupils are equal, round, and reactive to light.  Neck: Normal range of motion. Neck supple.  Cardiovascular: Normal rate, regular rhythm and normal heart sounds.  Exam reveals no gallop and no friction rub.   No murmur heard. Pulmonary/Chest: Effort normal and breath sounds normal. No respiratory distress.  Abdominal: Soft. Normal appearance and bowel sounds are normal. He exhibits no distension. There is no rebound and no guarding. Hernia confirmed negative in the right inguinal area and confirmed negative in the left inguinal area.    Genitourinary: Penis normal.    Right testis shows no mass, no swelling and no tenderness. Right testis is descended. Cremasteric reflex is not absent on the right side. Left testis shows no mass, no swelling and no tenderness. Left testis is descended. Cremasteric reflex is not absent on the left side.  Lymphadenopathy:       Right: No inguinal adenopathy present.       Left: No inguinal adenopathy present.  Neurological: He is alert and oriented to person, place, and time. He exhibits normal muscle tone. Coordination  normal.  Skin: Skin is warm and dry. No rash noted. No erythema.  Psychiatric: He has a normal mood and affect. His behavior is normal.  Nursing note and vitals reviewed.   ED Course  Procedures (including critical care time) Labs Review Labs Reviewed  BASIC METABOLIC PANEL  CBC WITH DIFFERENTIAL/PLATELET  URINALYSIS, ROUTINE W REFLEX MICROSCOPIC (NOT AT [facility])    Imaging Review US Scrotum  07/18/2014   CLINICAL  DATA:  Patient with bilateral scrotal pain.  EXAM: SCROTAL ULTRASOUND  DOPPLER ULTRASOUND OF THE TESTICLES  TECHNIQUE: Complete ultrasound examination of the testicles, epididymis, and other scrotal structures was performed. Color and spectral Doppler ultrasound were also utilized to evaluate blood flow to the testicles.  COMPARISON:  None.  FINDINGS: Right testicle  Measurements: 5.4 x 2.9 x 3.6 cm. No mass or microlithiasis visualized.  Left testicle  Measurements: 5.5 x 3.0 x 3.0 cm. No mass or microlithiasis visualized.  Right epididymis:  Normal in size and appearance.  Left epididymis:  Normal in size and appearance.  Hydrocele:  Small right hydrocele.  Varicocele:  Small bilateral varicoceles.  Pulsed Doppler interrogation of both testes demonstrates normal low resistance arterial and venous waveforms bilaterally.  IMPRESSION: Normal sonographic appearance to the testicles bilaterally without evidence for torsion.   Electronically Signed   By: Annia Belt M.D.   On: 07/18/2014 20:57   Ct Abdomen Pelvis W Contrast  07/18/2014   CLINICAL DATA:  Patient with right lower quadrant pain radiating to the umbilicus.  EXAM: CT ABDOMEN AND PELVIS WITH CONTRAST  TECHNIQUE: Multidetector CT imaging of the abdomen and pelvis was performed using the standard protocol following bolus administration of intravenous contrast.  CONTRAST:  OMNIPAQUE IOHEXOL 300 MG/ML  SOLN  COMPARISON:  Testicular ultrasound earlier same day.  FINDINGS: Lower chest: Normal heart size. No consolidative or nodular pulmonary opacities. No pleural effusion.  Hepatobiliary: Liver is normal in size and contour without focal hepatic lesion identified. Gallbladder is unremarkable.  Pancreas: Unremarkable  Spleen: Unremarkable  Adrenals/Urinary Tract: Normal adrenal glands. Kidneys enhance symmetrically with contrast. Urinary bladder is unremarkable. No hydronephrosis.  Stomach/Bowel: No abnormal bowel wall thickening or evidence for bowel  obstruction. Normal appendix. No free fluid or free intraperitoneal air.  Vascular/Lymphatic: Normal caliber abdominal aorta. No retroperitoneal lymphadenopathy.  Other: None  Musculoskeletal: No aggressive or acute appearing osseous lesions.  IMPRESSION: Normal appendix.  No acute process within the abdomen or pelvis.   Electronically Signed   By: Annia Belt M.D.   On: 07/18/2014 22:52   [place] Art/ven Flow Abd Pelv Doppler  07/18/2014   CLINICAL DATA:  Patient with bilateral scrotal pain.  EXAM: SCROTAL ULTRASOUND  DOPPLER ULTRASOUND OF THE TESTICLES  TECHNIQUE: Complete ultrasound examination of the testicles, epididymis, and other scrotal structures was performed. Color and spectral Doppler ultrasound were also utilized to evaluate blood flow to the testicles.  COMPARISON:  None.  FINDINGS: Right testicle  Measurements: 5.4 x 2.9 x 3.6 cm. No mass or microlithiasis visualized.  Left testicle  Measurements: 5.5 x 3.0 x 3.0 cm. No mass or microlithiasis visualized.  Right epididymis:  Normal in size and appearance.  Left epididymis:  Normal in size and appearance.  Hydrocele:  Small right hydrocele.  Varicocele:  Small bilateral varicoceles.  Pulsed Doppler interrogation of both testes demonstrates normal low resistance arterial and venous waveforms bilaterally.  IMPRESSION: Normal sonographic appearance to the testicles bilaterally without evidence for torsion.   Electronically Signed   By: Kenard Gower  Earlene Plater M.D.   On: 07/18/2014 20:57      I will have the patient follow up with the surgeon that he is scheduled to see.  There is no signs of incarcerated hernia on ct scan.  the ultrasound did not show any abnormality.  told to return here as needed.  Charlestine Night, PA-C 07/18/14 2310  Linwood Dibbles, MD 07/19/14 [phone]

## 2014-07-18 NOTE — Discharge Instructions (Signed)
Return here as needed.  There are no signs of hernia noted on CT scan

## 2014-08-02 ENCOUNTER — Encounter (HOSPITAL_COMMUNITY): Payer: Self-pay | Admitting: *Deleted

## 2014-08-05 NOTE — Anesthesia Preprocedure Evaluation (Addendum)
Anesthesia Evaluation  Patient identified by MRN, date of birth, ID band Patient awake    Reviewed: Allergy & Precautions, H&P , NPO status , Patient's Chart, lab work & pertinent test results, reviewed documented beta blocker date and time , Unable to perform ROS - Chart review only  History of Anesthesia Complications Negative for: history of anesthetic complications  Airway Mallampati: I  TM Distance: >3 FB Neck ROM: full    Dental no notable dental hx.    Pulmonary former smoker,  breath sounds clear to auscultation  Pulmonary exam normal       Cardiovascular negative cardio ROS Normal cardiovascular examRhythm:regular Rate:Normal     Neuro/Psych PSYCHIATRIC DISORDERS Anxiety negative neurological ROS     GI/Hepatic negative GI ROS, Neg liver ROS,   Endo/Other  negative endocrine ROS  Renal/GU negative Renal ROS     Musculoskeletal   Abdominal   Peds  Hematology negative hematology ROS (+)   Anesthesia Other Findings NPO appropriate, allergies reviewed Denies active cardiac or pulmonary symptoms, METS > 4 No recent congestive cough or symptoms of upper respiratory infection Meds - lexapro today, ibuprofen PRN   Reproductive/Obstetrics negative OB ROS                            Anesthesia Physical Anesthesia Plan  ASA: I  Anesthesia Plan: General   Post-op Pain Management:    Induction: Intravenous  Airway Management Planned: Oral ETT  Additional Equipment:   Intra-op Plan:   Post-operative Plan:   Informed Consent: I have reviewed the patients History and Physical, chart, labs and discussed the procedure including the risks, benefits and alternatives for the proposed anesthesia with the patient or authorized representative who has indicated his/her understanding and acceptance.   Dental Advisory Given  Plan Discussed with: Anesthesiologist and CRNA  Anesthesia Plan  Comments:        Anesthesia Quick Evaluation

## 2014-08-06 ENCOUNTER — Ambulatory Visit (HOSPITAL_COMMUNITY): Payer: Managed Care, Other (non HMO) | Admitting: Anesthesiology

## 2014-08-06 ENCOUNTER — Encounter (HOSPITAL_COMMUNITY)
Admission: RE | Disposition: A | Discharge status: Home / Self Care | Payer: Self-pay | Source: Ambulatory Visit | Attending: Surgery

## 2014-08-06 ENCOUNTER — Encounter (HOSPITAL_COMMUNITY): Payer: Self-pay | Admitting: *Deleted

## 2014-08-06 ENCOUNTER — Ambulatory Visit (HOSPITAL_COMMUNITY)
Admission: RE | Admit: 2014-08-06 | Discharge: 2014-08-06 | Disposition: A | Discharge status: Home / Self Care | Payer: Managed Care, Other (non HMO) | Source: Ambulatory Visit | Attending: Surgery | Admitting: Surgery

## 2014-08-06 DIAGNOSIS — K429 Umbilical hernia without obstruction or gangrene: Secondary | ICD-10-CM | POA: Diagnosis not present

## 2014-08-06 DIAGNOSIS — K4020 Bilateral inguinal hernia, without obstruction or gangrene, not specified as recurrent: Secondary | ICD-10-CM | POA: Insufficient documentation

## 2014-08-06 DIAGNOSIS — K4090 Unilateral inguinal hernia, without obstruction or gangrene, not specified as recurrent: Secondary | ICD-10-CM | POA: Diagnosis present

## 2014-08-06 DIAGNOSIS — Z87891 Personal history of nicotine dependence: Secondary | ICD-10-CM | POA: Diagnosis not present

## 2014-08-06 HISTORY — PX: UMBILICAL HERNIA REPAIR: SHX196

## 2014-08-06 HISTORY — PX: INSERTION OF MESH: SHX5868

## 2014-08-06 HISTORY — DX: Anxiety disorder, unspecified: F41.9

## 2014-08-06 HISTORY — PX: INGUINAL HERNIA REPAIR: SHX194

## 2014-08-06 LAB — CBC
HCT: 41.8 % (ref 39.0–52.0)
HEMOGLOBIN: 14.4 g/dL (ref 13.0–17.0)
MCH: 30.9 pg (ref 26.0–34.0)
MCHC: 34.4 g/dL (ref 30.0–36.0)
MCV: 89.7 fL (ref 78.0–100.0)
Platelets: 198 10*3/uL (ref 150–400)
RBC: 4.66 MIL/uL (ref 4.22–5.81)
RDW: 12.1 % (ref 11.5–15.5)
WBC: 5.8 10*3/uL (ref 4.0–10.5)

## 2014-08-06 SURGERY — REPAIR, HERNIA, INGUINAL, LAPAROSCOPIC
Anesthesia: General | Site: Groin

## 2014-08-06 MED ORDER — LACTATED RINGERS IV SOLN
INTRAVENOUS | Status: DC
  Administered 2014-08-06: 1000 mL via INTRAVENOUS

## 2014-08-06 MED ORDER — LIDOCAINE HCL (CARDIAC) 20 MG/ML IV SOLN
INTRAVENOUS | Status: DC | PRN
  Administered 2014-08-06: 75 mg via INTRAVENOUS
  Administered 2014-08-06: 50 mg via INTRATRACHEAL

## 2014-08-06 MED ORDER — PROMETHAZINE HCL 25 MG/ML IJ SOLN
6.2500 mg | INTRAMUSCULAR | Status: DC | PRN
Start: 2014-08-06 — End: 2014-08-06

## 2014-08-06 MED ORDER — FENTANYL CITRATE (PF) 100 MCG/2ML IJ SOLN
INTRAMUSCULAR | Status: AC
  Filled 2014-08-06: qty 2

## 2014-08-06 MED ORDER — 0.9 % SODIUM CHLORIDE (POUR BTL) OPTIME
TOPICAL | Status: DC | PRN
  Administered 2014-08-06: 1000 mL

## 2014-08-06 MED ORDER — LIDOCAINE HCL (CARDIAC) 20 MG/ML IV SOLN
INTRAVENOUS | Status: AC
  Filled 2014-08-06: qty 5

## 2014-08-06 MED ORDER — LACTATED RINGERS IV SOLN
INTRAVENOUS | Status: DC | PRN
  Administered 2014-08-06 (×2): via INTRAVENOUS

## 2014-08-06 MED ORDER — ONDANSETRON HCL 4 MG/2ML IJ SOLN
INTRAMUSCULAR | Status: AC
  Filled 2014-08-06: qty 2

## 2014-08-06 MED ORDER — ROCURONIUM BROMIDE 100 MG/10ML IV SOLN
INTRAVENOUS | Status: AC
  Filled 2014-08-06: qty 1

## 2014-08-06 MED ORDER — KETOROLAC TROMETHAMINE 30 MG/ML IJ SOLN
INTRAMUSCULAR | Status: AC
Start: 2014-08-06 — End: 2014-08-06
  Filled 2014-08-06: qty 1

## 2014-08-06 MED ORDER — HYDROMORPHONE HCL 1 MG/ML IJ SOLN: 0.2500 mg | INTRAMUSCULAR | Status: DC | PRN

## 2014-08-06 MED ORDER — GLYCOPYRROLATE 0.2 MG/ML IJ SOLN
INTRAMUSCULAR | Status: DC | PRN
  Administered 2014-08-06: .6 mg via INTRAVENOUS

## 2014-08-06 MED ORDER — DEXAMETHASONE SODIUM PHOSPHATE 10 MG/ML IJ SOLN
INTRAMUSCULAR | Status: AC
  Filled 2014-08-06: qty 1

## 2014-08-06 MED ORDER — FENTANYL CITRATE (PF) 100 MCG/2ML IJ SOLN: 25.0000 ug | INTRAMUSCULAR | Status: DC | PRN

## 2014-08-06 MED ORDER — BUPIVACAINE-EPINEPHRINE 0.25% -1:200000 IJ SOLN
INTRAMUSCULAR | Status: DC | PRN
Start: 2014-08-06 — End: 2014-08-06
  Administered 2014-08-06: 50 mL

## 2014-08-06 MED ORDER — NEOSTIGMINE METHYLSULFATE 10 MG/10ML IV SOLN
INTRAVENOUS | Status: DC | PRN
  Administered 2014-08-06: 4 mg via INTRAVENOUS

## 2014-08-06 MED ORDER — PROPOFOL 10 MG/ML IV BOLUS
INTRAVENOUS | Status: AC
  Filled 2014-08-06: qty 20

## 2014-08-06 MED ORDER — KETOROLAC TROMETHAMINE 30 MG/ML IJ SOLN
INTRAMUSCULAR | Status: DC | PRN
  Administered 2014-08-06: 30 mg via INTRAVENOUS

## 2014-08-06 MED ORDER — SUCCINYLCHOLINE CHLORIDE 20 MG/ML IJ SOLN
INTRAMUSCULAR | Status: DC | PRN
  Administered 2014-08-06: 100 mg via INTRAVENOUS

## 2014-08-06 MED ORDER — CHLORHEXIDINE GLUCONATE 4 % EX LIQD: 1.0000 "application " | Freq: Once | CUTANEOUS | Status: DC

## 2014-08-06 MED ORDER — ROCURONIUM BROMIDE 100 MG/10ML IV SOLN
INTRAVENOUS | Status: DC | PRN
  Administered 2014-08-06: 10 mg via INTRAVENOUS
  Administered 2014-08-06: 20 mg via INTRAVENOUS
  Administered 2014-08-06: 5 mg via INTRAVENOUS

## 2014-08-06 MED ORDER — IBUPROFEN 800 MG PO TABS: 400.0000 mg | ORAL_TABLET | Freq: Four times a day (QID) | ORAL | Status: DC

## 2014-08-06 MED ORDER — FENTANYL CITRATE (PF) 250 MCG/5ML IJ SOLN
INTRAMUSCULAR | Status: AC
Start: 2014-08-06 — End: 2014-08-06
  Filled 2014-08-06: qty 5

## 2014-08-06 MED ORDER — CEFAZOLIN SODIUM-DEXTROSE 2-3 GM-% IV SOLR
INTRAVENOUS | Status: AC
Start: 2014-08-06 — End: 2014-08-06
  Filled 2014-08-06: qty 50

## 2014-08-06 MED ORDER — CEFAZOLIN SODIUM-DEXTROSE 2-3 GM-% IV SOLR
2.0000 g | INTRAVENOUS | Status: AC
  Administered 2014-08-06: 2 g via INTRAVENOUS

## 2014-08-06 MED ORDER — FENTANYL CITRATE (PF) 100 MCG/2ML IJ SOLN
INTRAMUSCULAR | Status: DC | PRN
  Administered 2014-08-06 (×5): 50 ug via INTRAVENOUS
  Administered 2014-08-06: 100 ug via INTRAVENOUS

## 2014-08-06 MED ORDER — MIDAZOLAM HCL 2 MG/2ML IJ SOLN
INTRAMUSCULAR | Status: AC
Start: 2014-08-06 — End: 2014-08-06
  Filled 2014-08-06: qty 2

## 2014-08-06 MED ORDER — HYDROCODONE-ACETAMINOPHEN 5-325 MG PO TABS: 1.0000 | ORAL_TABLET | ORAL | Status: AC | PRN

## 2014-08-06 MED ORDER — ONDANSETRON HCL 4 MG/2ML IJ SOLN
INTRAMUSCULAR | Status: DC | PRN
  Administered 2014-08-06: 4 mg via INTRAVENOUS

## 2014-08-06 MED ORDER — BUPIVACAINE-EPINEPHRINE (PF) 0.25% -1:200000 IJ SOLN
INTRAMUSCULAR | Status: AC
  Filled 2014-08-06: qty 30

## 2014-08-06 MED ORDER — MIDAZOLAM HCL 5 MG/5ML IJ SOLN
INTRAMUSCULAR | Status: DC | PRN
  Administered 2014-08-06: 2 mg via INTRAVENOUS

## 2014-08-06 MED ORDER — HYDROCODONE-ACETAMINOPHEN 5-325 MG PO TABS
1.0000 | ORAL_TABLET | ORAL | Status: DC | PRN
  Administered 2014-08-06: 2 via ORAL
  Filled 2014-08-06: qty 2

## 2014-08-06 MED ORDER — PROPOFOL 10 MG/ML IV BOLUS
INTRAVENOUS | Status: DC | PRN
  Administered 2014-08-06: 200 mg via INTRAVENOUS

## 2014-08-06 MED ORDER — DEXAMETHASONE SODIUM PHOSPHATE 10 MG/ML IJ SOLN
INTRAMUSCULAR | Status: DC | PRN
  Administered 2014-08-06: 10 mg via INTRAVENOUS

## 2014-08-06 MED ORDER — BUPIVACAINE-EPINEPHRINE 0.25% -1:200000 IJ SOLN
INTRAMUSCULAR | Status: AC
  Filled 2014-08-06: qty 1

## 2014-08-06 SURGICAL SUPPLY — 29 items
CABLE HIGH FREQUENCY MONO STRZ (ELECTRODE) IMPLANT
CHLORAPREP W/TINT 26ML (MISCELLANEOUS) ×4 IMPLANT
COVER SURGICAL LIGHT HANDLE (MISCELLANEOUS) ×4 IMPLANT
DECANTER SPIKE VIAL GLASS SM (MISCELLANEOUS) ×4 IMPLANT
DEVICE SECURE STRAP 25 ABSORB (INSTRUMENTS) IMPLANT
DRAPE LAPAROSCOPIC ABDOMINAL (DRAPES) ×4 IMPLANT
DRAPE WARM FLUID 44X44 (DRAPE) ×4 IMPLANT
DRSG TEGADERM 2-3/8X2-3/4 SM (GAUZE/BANDAGES/DRESSINGS) ×8 IMPLANT
DRSG TEGADERM 4X4.75 (GAUZE/BANDAGES/DRESSINGS) ×4 IMPLANT
ELECT REM PT RETURN 9FT ADLT (ELECTROSURGICAL) ×4
ELECTRODE REM PT RTRN 9FT ADLT (ELECTROSURGICAL) ×2 IMPLANT
GLOVE ECLIPSE 8.0 STRL XLNG CF (GLOVE) ×4 IMPLANT
GLOVE INDICATOR 8.0 STRL GRN (GLOVE) ×4 IMPLANT
GOWN STRL REUS W/TWL XL LVL3 (GOWN DISPOSABLE) ×8 IMPLANT
KIT BASIN OR (CUSTOM PROCEDURE TRAY) ×4 IMPLANT
MESH ULTRAPRO 6X6 15CM15CM (Mesh General) ×6 IMPLANT
PEN SKIN MARKING BROAD (MISCELLANEOUS) ×4 IMPLANT
SCISSORS LAP 5X35 DISP (ENDOMECHANICALS) ×4 IMPLANT
SET IRRIG TUBING LAPAROSCOPIC (IRRIGATION / IRRIGATOR) IMPLANT
SLEEVE XCEL OPT CAN 5 100 (ENDOMECHANICALS) ×4 IMPLANT
SUT MNCRL AB 4-0 PS2 18 (SUTURE) ×4 IMPLANT
SUT PDS AB 1 CT1 27 (SUTURE) ×2 IMPLANT
SUT VIC AB 3-0 SH 27 (SUTURE)
SUT VIC AB 3-0 SH 27XBRD (SUTURE) IMPLANT
TACKER 5MM HERNIA 3.5CML NAB (ENDOMECHANICALS) IMPLANT
TOWEL OR 17X26 10 PK STRL BLUE (TOWEL DISPOSABLE) ×4 IMPLANT
TRAY LAPAROSCOPIC (CUSTOM PROCEDURE TRAY) ×4 IMPLANT
TROCAR BLADELESS OPT 5 100 (ENDOMECHANICALS) ×4 IMPLANT
TROCAR XCEL BLUNT TIP 100MML (ENDOMECHANICALS) ×4 IMPLANT

## 2014-08-06 NOTE — Anesthesia Procedure Notes (Signed)
Procedure Name: Intubation Date/Time: 08/06/2014 2:52 PM Performed by: Illene SilverEVANS, Ahava Kissoon E Pre-anesthesia Checklist: Patient identified, Emergency Drugs available, Suction available and Patient being monitored Patient Re-evaluated:Patient Re-evaluated prior to inductionOxygen Delivery Method: Circle System Utilized Preoxygenation: Pre-oxygenation with 100% oxygen Intubation Type: IV induction Ventilation: Mask ventilation without difficulty Laryngoscope Size: Mac and 4 Grade View: Grade I Tube type: Oral Tube size: 8.0 mm Number of attempts: 1 Airway Equipment and Method: Stylet and Oral airway Placement Confirmation: ETT inserted through vocal cords under direct vision,  positive ETCO2 and breath sounds checked- equal and bilateral Secured at: 22 cm Tube secured with: Tape Dental Injury: Teeth and Oropharynx as per pre-operative assessment

## 2014-08-06 NOTE — H&P (View-Only) (Signed)
Chad Patton 07/09/2014 10:02 AM Location: [facility] Patient #: [id number] DOB: 10/29/1985 Married / Language: English / Race: White Male History of Present Illness (Marixa Mellott C. Manroop Jakubowicz MD; 07/09/2014 7:01 PM) The patient is a 29 year old male who presents with an incisional hernia. Patient sent by [facility] physician assistant Noelle Redman for concern of bulging, probable hernia.  Patient felt sharp pain in RIGHT lower quadrant hernia. Active construction worker. Concerned that his become more constipated. Comes today with his wife. Not large bulge but sometimes worse with activity. No fevers or chills. Nephrology urination. No prior abdominal hernia surgery. Very active. Has not smoked in many years.  Physical Exam (Jettson Crable C. Nneka Blanda MD; 07/09/2014 11:03 AM)  General Mental Status-Alert. General Appearance-Not in acute distress, Not Sickly. Orientation-Oriented X3. Hydration-Well hydrated. Voice-Normal.  Integumentary Global Assessment Upon inspection and palpation of skin surfaces of the - Axillae: non-tender, no inflammation or ulceration, no drainage. and Distribution of scalp and body hair is normal. General Characteristics Temperature - normal warmth is noted.  Head and Neck Head-normocephalic, atraumatic with no lesions or palpable masses. Face Global Assessment - atraumatic, no absence of expression. Neck Global Assessment - no abnormal movements, no bruit auscultated on the right, no bruit auscultated on the left, no decreased range of motion, non-tender. Trachea-midline. Thyroid Gland Characteristics - non-tender.  Eye Eyeball - Left-Extraocular movements intact, No Nystagmus. Eyeball - Right-Extraocular movements intact, No Nystagmus. Cornea - Left-No Hazy. Cornea - Right-No Hazy. Sclera/Conjunctiva - Left-No scleral icterus, No Discharge. Sclera/Conjunctiva - Right-No scleral icterus, No Discharge. Pupil - Left-Direct  reaction to light normal. Pupil - Right-Direct reaction to light normal.  ENMT Ears Pinna - Left - no drainage observed, no generalized tenderness observed. Right - no drainage observed, no generalized tenderness observed. Nose and Sinuses External Inspection of the Nose - no destructive lesion observed. Inspection of the nares - Left - quiet respiration. Right - quiet respiration. Mouth and Throat Lips - Upper Lip - no fissures observed, no pallor noted. Lower Lip - no fissures observed, no pallor noted. Nasopharynx - no discharge present. Oral Cavity/Oropharynx - Tongue - no dryness observed. Oral Mucosa - no cyanosis observed. Hypopharynx - no evidence of airway distress observed.  Chest and Lung Exam Inspection Movements - Normal and Symmetrical. Accessory muscles - No use of accessory muscles in breathing. Palpation Palpation of the chest reveals - Non-tender. Auscultation Breath sounds - Normal and Clear.  Cardiovascular Auscultation Rhythm - Regular. Murmurs & Other Heart Sounds - Auscultation of the heart reveals - No Murmurs and No Systolic Clicks.  Abdomen Inspection Inspection of the abdomen reveals - No Visible peristalsis and No Abnormal pulsations. Umbilicus - No Bleeding, No Urine drainage. Palpation/Percussion Palpation and Percussion of the abdomen reveal - Soft, Non Tender, No Rebound tenderness, No Rigidity (guarding) and No Cutaneous hyperesthesia. Note: Soft and flat. Very sensitive umbilical hernia.   Male Genitourinary Sexual Maturity Tanner 5 - Adult hair pattern and Adult penile size and shape. Note: Normal external male genitalia. Circumcised. Testes and cords normal. Obvious RIGHT groin bulge consistent with inguinal hernia. Reducible. Subtle impulse on exam but no definite LEFT inguinal hernia.   Peripheral Vascular Upper Extremity Inspection - Left - No Cyanotic nailbeds, Not Ischemic. Right - No Cyanotic nailbeds, Not  Ischemic.  Neurologic Neurologic evaluation reveals -normal attention span and ability to concentrate, able to name objects and repeat phrases. Appropriate fund of knowledge , normal sensation and normal coordination. Mental Status Affect - not angry, not   paranoid. Cranial Nerves-Normal Bilaterally. Gait-Normal.  Neuropsychiatric Mental status exam performed with findings of-able to articulate well with normal speech/language, rate, volume and coherence, thought content normal with ability to perform basic computations and apply abstract reasoning and no evidence of hallucinations, delusions, obsessions or homicidal/suicidal ideation.  Musculoskeletal Global Assessment Spine, Ribs and Pelvis - no instability, subluxation or laxity. Right Upper Extremity - no instability, subluxation or laxity.  Lymphatic Head & Neck  General Head & Neck Lymphatics: Bilateral - Description - No Localized lymphadenopathy. Axillary  General Axillary Region: Bilateral - Description - No Localized lymphadenopathy. Femoral & Inguinal  Generalized Femoral & Inguinal Lymphatics: Left - Description - No Localized lymphadenopathy. Right - Description - No Localized lymphadenopathy.    Assessment & Plan Ardeth Sportsman(Diora Bellizzi C. Zakariyah Freimark MD; 07/09/2014 11:04 AM)  RIGHT INGUINAL HERNIA (550.90  K40.90) Impression: Obvious RIGHT inguinal hernia. Possible LEFT as well. He would benefit from surgical repair, especially given his moderately intense physical activity. Low threshold to fix LEFT side if seen. Reasonable laparoscopic approach. Patient and his wife are interested in proceeding.  Current Plans Pt Education - Pamphlet Given - Laparoscopic Hernia Repair: discussed with patient and provided information. Pt Education - [facility] Hernia Post-Op HCI (Essie Gehret): discussed with patient and provided information. Pt Education - [facility] Good Bowel Health (Alexica Schlossberg) Pt Education - [facility] Pain Control (Reginal Wojcicki) Schedule for Surgery Written  instructions provided Discussed regular exercise with patient.  Ardeth SportsmanSteven C. Karin Pinedo, M.D., F.A.C.S. Gastrointestinal and [facility]imally Invasive [facility] [address] [place], [place] [id number] [phone] Main / Paging

## 2014-08-06 NOTE — Discharge Instructions (Signed)
HERNIA REPAIR: POST OP INSTRUCTIONS ° °1. DIET: Follow a light bland diet the first 24 hours after arrival home, such as soup, liquids, crackers, etc.  Be sure to include lots of fluids daily.  Avoid fast food or heavy meals as your are more likely to get nauseated.  Eat a low fat the next few days after surgery. °2. Take your usually prescribed home medications unless otherwise directed. °3. PAIN CONTROL: °a. Pain is best controlled by a usual combination of three different methods TOGETHER: °i. Ice/Heat °ii. Over the counter pain medication °iii. Prescription pain medication °b. Most patients will experience some swelling and bruising around the hernia(s) such as the bellybutton, groins, or old incisions.  Ice packs or heating pads (30-60 minutes up to 6 times a day) will help. Use ice for the first few days to help decrease swelling and bruising, then switch to heat to help relax tight/sore spots and speed recovery.  Some people prefer to use ice alone, heat alone, alternating between ice & heat.  Experiment to what works for you.  Swelling and bruising can take several weeks to resolve.   °c. It is helpful to take an over-the-counter pain medication regularly for the first few weeks.  Choose one of the following that works best for you: °i. Naproxen (Aleve, etc)  Two 220mg tabs twice a day °ii. Ibuprofen (Advil, etc) Three 200mg tabs four times a day (every meal & bedtime) °iii. Acetaminophen (Tylenol, etc) 325-650mg four times a day (every meal & bedtime) °d. A  prescription for pain medication should be given to you upon discharge.  Take your pain medication as prescribed.  °i. If you are having problems/concerns with the prescription medicine (does not control pain, nausea, vomiting, rash, itching, etc), please call us [phone] to see if we need to switch you to a different pain medicine that will work better for you and/or control your side effect better. °ii. If you need a refill on your pain  medication, please contact your pharmacy.  They will contact our office to request authorization. Prescriptions will not be filled after 5 pm or on week-ends. °4. Avoid getting constipated.  Between the surgery and the pain medications, it is common to experience some constipation.  Increasing fluid intake and taking a fiber supplement (such as Metamucil, Citrucel, FiberCon, MiraLax, etc) 1-2 times a day regularly will usually help prevent this problem from occurring.  A mild laxative (prune juice, Milk of Magnesia, MiraLax, etc) should be taken according to package directions if there are no bowel movements after 48 hours.   °5. Wash / shower every day.  You may shower over the dressings as they are waterproof.   °6. Remove your waterproof bandages 5 days after surgery.  You may leave the incision open to air.  You may replace a dressing/Band-Aid to cover the incision for comfort if you wish.  Continue to shower over incision(s) after the dressing is off. ° ° ° °7. ACTIVITIES as tolerated:   °a. You may resume regular (light) daily activities beginning the next day--such as daily self-care, walking, climbing stairs--gradually increasing activities as tolerated.  If you can walk 30 minutes without difficulty, it is safe to try more intense activity such as jogging, treadmill, bicycling, low-impact aerobics, swimming, etc. °b. Save the most intensive and strenuous activity for last such as sit-ups, heavy lifting, contact sports, etc  Refrain from any heavy lifting or straining until you are off narcotics for pain control.   °  c. DO NOT PUSH THROUGH PAIN.  Let pain be your guide: If it hurts to do something, don't do it.  Pain is your body warning you to avoid that activity for another week until the pain goes down. °d. You may drive when you are no longer taking prescription pain medication, you can comfortably wear a seatbelt, and you can safely maneuver your car and apply brakes. °e. You may have sexual intercourse  when it is comfortable.  °8. FOLLOW UP in our office °a. Please call [facility] at [phone] to set up an appointment to see your surgeon in the office for a follow-up appointment approximately 2-3 weeks after your surgery. °b. Make sure that you call for this appointment the day you arrive home to insure a convenient appointment time. °9.  IF YOU HAVE DISABILITY OR FAMILY LEAVE FORMS, BRING THEM TO THE OFFICE FOR PROCESSING.  DO NOT GIVE THEM TO YOUR DOCTOR. ° °WHEN TO CALL US [phone]: °1. Poor pain control °2. Reactions / problems with new medications (rash/itching, nausea, etc)  °3. Fever over 101.5 F (38.5 C) °4. Inability to urinate °5. Nausea and/or vomiting °6. Worsening swelling or bruising °7. Continued bleeding from incision. °8. Increased pain, redness, or drainage from the incision ° ° The clinic staff is available to answer your questions during regular business hours (8:30am-5pm).  Please don’t hesitate to call and ask to speak to one of our nurses for clinical concerns.  ° If you have a medical emergency, go to the nearest emergency room or call 911. ° A surgeon from [facility] is always on call at the hospitals in [place] ° °[facility] °[address], [address], [place], [place]  [address] ? ° [address] [address], [place], [place]   [address] °MAIN: [phone] ? TOLL FREE: [phone] ? FAX: [phone] °[email or web address] ° °GETTING TO GOOD BOWEL HEALTH. °Irregular bowel habits such as constipation and diarrhea can lead to many problems over time.  Having one soft bowel movement a day is the most important way to prevent further problems.  The anorectal canal is designed to handle stretching and feces to safely manage our ability to get rid of solid waste (feces, poop, stool) out of our body.  BUT, hard constipated stools can act like ripping concrete bricks and diarrhea can be a burning fire to this very sensitive area of our body, causing  inflamed hemorrhoids, anal fissures, increasing risk is perirectal abscesses, abdominal pain/bloating, an making irritable bowel worse.     ° °The goal: ONE SOFT BOWEL MOVEMENT A DAY!  To have soft, regular bowel movements:  °• Drink plenty of fluids, consider 4-6 tall glasses of water a day.   °• Take plenty of fiber.  Fiber is the undigested part of plant food that passes into the colon, acting s “natures broom” to encourage bowel motility and movement.  Fiber can absorb and hold large amounts of water. This results in a larger, bulkier stool, which is soft and easier to pass. Work gradually over several weeks up to 6 servings a day of fiber (25g a day even more if needed) in the form of: °o Vegetables -- Root (potatoes, carrots, turnips), leafy green (lettuce, salad greens, celery, spinach), or cooked high residue (cabbage, broccoli, etc) °o Fruit -- Fresh (unpeeled skin & pulp), Dried (prunes, apricots, cherries, etc ),  or stewed ( applesauce)  °o Whole grain breads, pasta, etc (whole wheat)  °o Bran cereals  °•   Bulking Agents -- This type of water-retaining fiber generally is easily obtained each day by one of the following:  o Psyllium bran -- The psyllium plant is remarkable because its ground seeds can retain so much water. This product is available as Metamucil, Konsyl, Effersyllium, Per Diem Fiber, or the less expensive generic preparation in drug and health food stores. Although labeled a laxative, it really is not a laxative.  o Methylcellulose -- This is another fiber derived from wood which also retains water. It is available as Citrucel. o Polyethylene Glycol - and artificial fiber commonly called Miralax or Glycolax.  It is helpful for people with gassy or bloated feelings with regular fiber o Flax Seed - a less gassy fiber than psyllium  No reading or other relaxing activity while on the toilet. If bowel movements take longer than 5 minutes, you are too constipated  AVOID CONSTIPATION.   High fiber and water intake usually takes care of this.  Sometimes a laxative is needed to stimulate more frequent bowel movements, but   Laxatives are not a good long-term solution as it can wear the colon out.  They can help jump-start bowels if constipated, but should be relied on constantly without discussing with your doctor o Osmotics (Milk of Magnesia, Fleets phosphosoda, Magnesium citrate, MiraLax, GoLytely) are safer than  o Stimulants (Senokot, Castor Oil, Dulcolax, Ex Lax)    o Avoid taking laxatives for more than 7 days in a row.   IF SEVERELY CONSTIPATED, try a Bowel Retraining Program: o Do not use laxatives.  o Eat a diet high in roughage, such as bran cereals and leafy vegetables.  o Drink six (6) ounces of prune or apricot juice each morning.  o Eat two (2) large servings of stewed fruit each day.  o Take one (1) heaping tablespoon of a psyllium-based bulking agent twice a day. Use sugar-free sweetener when possible to avoid excessive calories.  o Eat a normal breakfast.  o Set aside 15 minutes after breakfast to sit on the toilet, but do not strain to have a bowel movement.  o If you do not have a bowel movement by the third day, use an enema and repeat the above steps.   Controlling diarrhea o Switch to liquids and simpler foods for a few days to avoid stressing your intestines further. o Avoid dairy products (especially milk & ice cream) for a short time.  The intestines often can lose the ability to digest lactose when stressed. o Avoid foods that cause gassiness or bloating.  Typical foods include beans and other legumes, cabbage, broccoli, and dairy foods.  Every person has some sensitivity to other foods, so listen to our body and avoid those foods that trigger problems for you. o Adding fiber (Citrucel, Metamucil, psyllium, Miralax) gradually can help thicken stools by absorbing excess fluid and retrain the intestines to act more normally.  Slowly increase the dose over  a few weeks.  Too much fiber too soon can backfire and cause cramping & bloating. o Probiotics (such as active yogurt, Align, etc) may help repopulate the intestines and colon with normal bacteria and calm down a sensitive digestive tract.  Most studies show it to be of mild help, though, and such products can be costly. o Medicines: - Bismuth subsalicylate (ex. Kayopectate, Pepto Bismol) every 30 minutes for up to 6 doses can help control diarrhea.  Avoid if pregnant. - Loperamide (Immodium) can slow down diarrhea.  Start with two tablets (23m total) first  and then try one tablet every 6 hours.  Avoid if you are having fevers or severe pain.  If you are not better or start feeling worse, stop all medicines and call your doctor for advice o Call your doctor if you are getting worse or not better.  Sometimes further testing (cultures, endoscopy, X-ray studies, bloodwork, etc) may be needed to help diagnose and treat the cause of the diarrhea.  TROUBLESHOOTING IRREGULAR BOWELS 1) Avoid extremes of bowel movements (no bad constipation/diarrhea) 2) Miralax 17gm mixed in 8oz. water or juice-daily. May use BID as needed.  3) Gas-x,Phazyme, etc. as needed for gas & bloating.  4) Soft,bland diet. No spicy,greasy,fried foods.  5) Prilosec over-the-counter as needed  6) May hold gluten/wheat products from diet to see if symptoms improve.  7)  May try probiotics (Align, Activa, etc) to help calm the bowels down 7) If symptoms become worse call back immediately.  Managing Pain  Pain after surgery or related to activity is often due to strain/injury to muscle, tendon, nerves and/or incisions.  This pain is usually short-term and will improve in a few months.   Many people find it helpful to do the following things TOGETHER to help speed the process of healing and to get back to regular activity more quickly:  1. Avoid heavy physical activity at first a. No lifting greater than 20 pounds at first, then  increase to lifting as tolerated over the next few weeks b. Do not push through the pain.  Listen to your body and avoid positions and maneuvers than reproduce the pain.  Wait a few days before trying something more intense c. Walking is okay as tolerated, but go slowly and stop when getting sore.  If you can walk 30 minutes without stopping or pain, you can try more intense activity (running, jogging, aerobics, cycling, swimming, treadmill, sex, sports, weightlifting, etc ) d. Remember: If it hurts to do it, then dont do it!  2. Take Anti-inflammatory medication a. Choose ONE of the following over-the-counter medications: i.            Acetaminophen 500mg  tabs (Tylenol) 1-2 pills with every meal and just before bedtime (avoid if you have liver problems) ii.            Naproxen 220mg  tabs (ex. Aleve) 1-2 pills twice a day (avoid if you have kidney, stomach, IBD, or bleeding problems) iii. Ibuprofen 200mg  tabs (ex. Advil, Motrin) 3-4 pills with every meal and just before bedtime (avoid if you have kidney, stomach, IBD, or bleeding problems) b. Take with food/snack around the clock for 1-2 weeks i. This helps the muscle and nerve tissues become less irritable and calm down faster  3. Use a Heating pad or Ice/Cold Pack a. 4-6 times a day b. May use warm bath/hottub  or showers  4. Try Gentle Massage and/or Stretching  a. at the area of pain many times a day b. stop if you feel pain - do not overdo it  Try these steps together to help you body heal faster and avoid making things get worse.  Doing just one of these things may not be enough.    If you are not getting better after two weeks or are noticing you are getting worse, contact our office for further advice; we may need to re-evaluate you & see what other things we can do to help.  Inguinal Hernia, Adult Muscles help keep everything in the body in its proper place. But if  a weak spot in the muscles develops, something can poke through.  That is called a hernia. When this happens in the lower part of the belly (abdomen), it is called an inguinal hernia. (It takes its name from a part of the body in this region called the inguinal canal.) A weak spot in the wall of muscles lets some fat or part of the small intestine bulge through. An inguinal hernia can develop at any age. Men get them more often than women. CAUSES  In adults, an inguinal hernia develops over time.  It can be triggered by:  Suddenly straining the muscles of the lower abdomen.  Lifting heavy objects.  Straining to have a bowel movement. Difficult bowel movements (constipation) can lead to this.  Constant coughing. This may be caused by smoking or lung disease.  Being overweight.  Being pregnant.  Working at a job that requires long periods of standing or heavy lifting.  Having had an inguinal hernia before. One type can be an emergency situation. It is called a strangulated inguinal hernia. It develops if part of the small intestine slips through the weak spot and cannot get back into the abdomen. The blood supply can be cut off. If that happens, part of the intestine may die. This situation requires emergency surgery. SYMPTOMS  Often, a small inguinal hernia has no symptoms. It is found when a healthcare provider does a physical exam. Larger hernias usually have symptoms.   In adults, symptoms may include:  A lump in the groin. This is easier to see when the person is standing. It might disappear when lying down.  In men, a lump in the scrotum.  Pain or burning in the groin. This occurs especially when lifting, straining or coughing.  A dull ache or feeling of pressure in the groin.  Signs of a strangulated hernia can include:  A bulge in the groin that becomes very painful and tender to the touch.  A bulge that turns red or purple.  Fever, nausea and vomiting.  Inability to have a bowel movement or to pass gas. DIAGNOSIS  To decide if  you have an inguinal hernia, a healthcare provider will probably do a physical examination.  This will include asking questions about any symptoms you have noticed.  The healthcare provider might feel the groin area and ask you to cough. If an inguinal hernia is felt, the healthcare provider may try to slide it back into the abdomen.  Usually no other tests are needed. TREATMENT  Treatments can vary. The size of the hernia makes a difference. Options include:  Watchful waiting. This is often suggested if the hernia is small and you have had no symptoms.  No medical procedure will be done unless symptoms develop.  You will need to watch closely for symptoms. If any occur, contact your healthcare provider right away.  Surgery. This is used if the hernia is larger or you have symptoms.  Open surgery. This is usually an outpatient procedure (you will not stay overnight in a hospital). An cut (incision) is made through the skin in the groin. The hernia is put back inside the abdomen. The weak area in the muscles is then repaired by herniorrhaphy or hernioplasty. Herniorrhaphy: in this type of surgery, the weak muscles are sewn back together. Hernioplasty: a patch or mesh is used to close the weak area in the abdominal wall.  Laparoscopy. In this procedure, a surgeon makes small incisions. A thin tube with a tiny video  camera (called a laparoscope) is put into the abdomen. The surgeon repairs the hernia with mesh by looking with the video camera and using two long instruments. HOME CARE INSTRUCTIONS   After surgery to repair an inguinal hernia:  You will need to take pain medicine prescribed by your healthcare provider. Follow all directions carefully.  You will need to take care of the wound from the incision.  Your activity will be restricted for awhile. This will probably include no heavy lifting for several weeks. You also should not do anything too active for a few weeks. When you can  return to work will depend on the type of job that you have.  During "watchful waiting" periods, you should:  Maintain a healthy weight.  Eat a diet high in fiber (fruits, vegetables and whole grains).  Drink plenty of fluids to avoid constipation. This means drinking enough water and other liquids to keep your urine clear or pale yellow.  Do not lift heavy objects.  Do not stand for long periods of time.  Quit smoking. This should keep you from developing a frequent cough. SEEK MEDICAL CARE IF:   A bulge develops in your groin area.  You feel pain, a burning sensation or pressure in the groin. This might be worse if you are lifting or straining.  You develop a fever of more than 100.5 F (38.1 C). SEEK IMMEDIATE MEDICAL CARE IF:   Pain in the groin increases suddenly.  A bulge in the groin gets bigger suddenly and does not go down.  For men, there is sudden pain in the scrotum. Or, the size of the scrotum increases.  A bulge in the groin area becomes red or purple and is painful to touch.  You have nausea or vomiting that does not go away.  You feel your heart beating much faster than normal.  You cannot have a bowel movement or pass gas.  You develop a fever of more than 102.0 F (38.9 C). Document Released: 05/30/2008 Document Revised: 04/05/2011 Document Reviewed: 05/30/2008 [facility] Patient Information 2015 [employer], [place]. This information is not intended to replace advice given to you by your health care provider. Make sure you discuss any questions you have with your health care provider.

## 2014-08-06 NOTE — Interval H&P Note (Signed)
History and Physical Interval Note:  08/06/2014 2:10 PM  [name]  has presented today for surgery, with the diagnosis of Right and Possible Left Hernias  The various methods of treatment have been discussed with the patient and family. After consideration of risks, benefits and other options for treatment, the patient has consented to  Procedure(s): LAPAROSCOPIC RIGHT AND POSSIBLE LEFT INGUINAL HERNIA REPAIR (Right) as a surgical intervention .  The patient's history has been reviewed, patient examined, no change in status, stable for surgery.  I have reviewed the patient's chart and labs.  Questions were answered to the patient's satisfaction.     Atoya Andrew C.

## 2014-08-06 NOTE — Interval H&P Note (Signed)
History and Physical Interval Note:  08/06/2014 2:09 PM  Chad Patton  has presented today for surgery, with the diagnosis of Right and Possible Left Hernias  The various methods of treatment have been discussed with the patient and family. After consideration of risks, benefits and other options for treatment, the patient has consented to  Procedure(s): LAPAROSCOPIC RIGHT AND POSSIBLE LEFT INGUINAL HERNIA REPAIR (Right) as a surgical intervention .  The patient's history has been reviewed, patient examined, no change in status, stable for surgery.  I have reviewed the patient's chart and labs.  Questions were answered to the patient's satisfaction.     [name]

## 2014-08-06 NOTE — Transfer of Care (Signed)
Immediate Anesthesia Transfer of Care Note  Patient: Chad BradfordCorey Patton  Procedure(s) Performed: Procedure(s): LAPAROSCOPIC RIGHT AND  LEFT INGUINAL HERNIA REPAIR (Bilateral) INSERTION OF MESH (Bilateral) HERNIA REPAIR UMBILICAL ADULT (N/A)  Patient Location: PACU  Anesthesia Type:General  Level of Consciousness: awake, oriented, patient cooperative, lethargic and responds to stimulation  Airway & Oxygen Therapy: Patient Spontanous Breathing and Patient connected to face mask oxygen  Post-op Assessment: Report given to RN, Post -op Vital signs reviewed and stable and Patient moving all extremities  Post vital signs: Reviewed and stable  Last Vitals:  Filed Vitals:   08/06/14 1121  BP: 107/84  Pulse: 57  Resp: 16    Complications: No apparent anesthesia complications

## 2014-08-06 NOTE — Anesthesia Postprocedure Evaluation (Signed)
  Anesthesia Post-op Note  Patient: Chad Patton  Procedure(s) Performed: Procedure(s) (LRB): LAPAROSCOPIC RIGHT AND  LEFT INGUINAL HERNIA REPAIR (Bilateral) INSERTION OF MESH (Bilateral) HERNIA REPAIR UMBILICAL ADULT (N/A)  Patient Location: PACU  Anesthesia Type: General  Level of Consciousness: awake and alert   Airway and Oxygen Therapy: Patient Spontanous Breathing  Post-op Pain: mild  Post-op Assessment: Post-op Vital signs reviewed, Patient's Cardiovascular Status Stable, Respiratory Function Stable, Patent Airway and No signs of Nausea or vomiting  Last Vitals:  Filed Vitals:   08/06/14 1722  BP: 121/70  Pulse: 54  Temp: 36.8 C  Resp: 14    Post-op Vital Signs: stable   Complications: No apparent anesthesia complications

## 2014-08-06 NOTE — Op Note (Signed)
08/06/2014  4:24 PM  PATIENT:  Chad Patton  29 y.o. male  Patient Care Team: Milus Height, PA-C as PCP - General (Nurse Practitioner)  PRE-OPERATIVE DIAGNOSIS:  Right and Possible Left Hernias  POST-OPERATIVE DIAGNOSIS:    Right and left inguinal hernia Umbilical hernia  PROCEDURE:    LAPAROSCOPIC BILATERAL INGUINAL HERNIA REPAIRS INSERTION OF MESH HERNIA REPAIR UMBILICAL ADULT  SURGEON:  Surgeon(s): Karie Soda, MD  ASSISTANT: RN   ANESTHESIA:   Regional ilioinguinal and genitofemoral and spermatic cord nerve blocks with GETA  EBL:  Total I/O In: 850 [I.V.:850] Out: -   Delay start of Pharmacological VTE agent (>24hrs) due to surgical blood loss or risk of bleeding:  no  DRAINS: NONE  SPECIMEN:  NONE  DISPOSITION OF SPECIMEN:  N/A  COUNTS:  YES  PLAN OF CARE: Discharge to home after PACU  PATIENT DISPOSITION:  PACU - hemodynamically stable.  INDICATION: Active male with small but very sensitive right internal hernia.  Possible left inguinal hernia.  Possible umbilical hernia.  I recommended diagnostic laparoscopy and repair of hernias found.  The anatomy & physiology of the abdominal wall and pelvic floor was discussed.  The pathophysiology of hernias in the inguinal and pelvic region was discussed.  Natural history risks such as progressive enlargement, pain, incarceration & strangulation was discussed.   Contributors to complications such as smoking, obesity, diabetes, prior surgery, etc were discussed.    I feel the risks of no intervention will lead to serious problems that outweigh the operative risks; therefore, I recommended surgery to reduce and repair the hernia.  I explained laparoscopic techniques with possible need for an open approach.  I noted usual use of mesh to patch and/or buttress hernia repair  Risks such as bleeding, infection, abscess, need for further treatment, heart attack, death, and other risks were discussed.  I noted a good  likelihood this will help address the problem.   Goals of post-operative recovery were discussed as well.  Possibility that this will not correct all symptoms was explained.  I stressed the importance of low-impact activity, aggressive pain control, avoiding constipation, & not pushing through pain to minimize risk of post-operative chronic pain or injury. Possibility of reherniation was discussed.  We will work to minimize complications.     An educational handout further explaining the pathology & treatment options was given as well.  Questions were answered.  The patient expresses understanding & wishes to proceed with surgery.  OR FINDINGS: Right direct/indirect inguinal hernias.  Left indirect hernia.  Possible small left femoral hernia.  DESCRIPTION:   The patient was identified & brought into the operating room. The patient was positioned supine with arms tucked. SCDs were active during the entire case. The patient underwent general anesthesia without any difficulty.  The abdomen was prepped and draped in a sterile fashion. The patient's bladder was emptied.  A Surgical Timeout confirmed our plan.  I made a transverse incision through the inferior umbilical fold.  I made a small transverse nick through the anterior rectus fascia contralateral to the inguinal hernia side and placed a 0-vicryl stitch through the fascia.  I placed a Hasson trocar into the preperitoneal plane.  Entry was clean.  We induced carbon dioxide insufflation. Camera inspection revealed no injury.  I used a 10mm angled scope to bluntly free the peritoneum off the infraumbilical anterior abdominal wall.  I created enough of a preperitoneal pocket to place 5mm ports into the right & left mid-abdomen into this preperitoneal  cavity.  I focused attention on the right side since that was the dominant hernia side.   I used blunt & focused sharp dissection to free the peritoneum off the flank and down to the pubic rim.  I freed the  anteriolateral bladder wall off the anteriolateral pelvic wall, sparing midline attachments.   I located a swath of peritoneum going into a hernia fascial defect at the direct space consistent with a moderate sized medial direct inguinal hernia.  Patient also had an indirect hernia I freed the peritoneal hernia sac out of the inguinal canal.  I gradually freed the peritoneal hernia sac off safely and reduced it into the preperitoneal space.  I freed the peritoneum off the spermatic vessels & vas deferens.  I freed peritoneum off the retroperitoneum along the psoas muscle.    I checked & assured hemostasis.    I turned attention on the opposite side.  I did dissection in a similar, mirror-image fashion. The patient had a small but definite indirect left inguinal hernia.  Some laxity in the femoral canal but no definite hernia.  Mild stretching the direct space but no definite hernia.  There were no breaches in peritoneum that required closure.  I chose 15x15 cm sheets of ultra-lightweight polypropylene mesh (Ultrapro), one for each side.  I cut a single sigmoid-shaped slit ~6cm from a corner of each mesh.  I placed the meshes into the preperitoneal space & laid them as overlapping diamonds such that at the inferior points, a 6x6 cm corner flap rested in the true anterolateral pelvis, covering the obturator & femoral foramina.   I allowed the bladder to fall back and help tuck the corners of the mesh in.  The medial corners overlapped each other across midline cephalad to the pubic rim.   However the overlap was fair.  Therefore placed a third mesh in the center as a diamond.  This allowed much better overlap to each internal ring.  Much better coverage of the moderate size direct space hernia on the right side.  This provided >2 inch coverage around the hernias.  Because the defects were well covered, I did not need tacks to hold the mesh in place  I held the hernia sacs cephalad & evacuated carbon dioxide.  I  fried the umbilical stalk off the anterior fascia in found a 1 cm umbilical hernia with some omentum and preperitoneal fat within it.  I primarily closed that using #1 PDS interrupted sutures.  I re-tacked the umbilical stalk to the fascia with a 0 bike or suture.  I closed the fascia 0 vicryl suture.  I closed the skin using 4-0 monocryl stitch.  Sterile dressings were applied. The patient was extubated & arrived in the PACU in stable condition..  I had discussed postoperative care with the patient in the holding area.   I did discuss operative findings and postoperative goals / instructions to family as well.  Instructions are written in the chart.  Ardeth SportsmanSteven C. Ariyannah Pauling, M.D., F.A.C.S. Gastrointestinal and Minimally Invasive Surgery [facility] [address] [place], [place] [id number] [phone] Main / Paging

## 2014-08-07 ENCOUNTER — Encounter (HOSPITAL_COMMUNITY): Payer: Self-pay | Admitting: Surgery

## 2015-02-02 ENCOUNTER — Emergency Department (HOSPITAL_COMMUNITY)
Admission: EM | Admit: 2015-02-02 | Discharge: 2015-02-02 | Disposition: A | Discharge status: Home / Self Care | Payer: BLUE CROSS/BLUE SHIELD | Attending: Emergency Medicine | Admitting: Emergency Medicine

## 2015-02-02 ENCOUNTER — Encounter (HOSPITAL_COMMUNITY): Payer: Self-pay | Admitting: *Deleted

## 2015-02-02 DIAGNOSIS — Z87891 Personal history of nicotine dependence: Secondary | ICD-10-CM | POA: Insufficient documentation

## 2015-02-02 DIAGNOSIS — Y9389 Activity, other specified: Secondary | ICD-10-CM | POA: Diagnosis not present

## 2015-02-02 DIAGNOSIS — Y998 Other external cause status: Secondary | ICD-10-CM | POA: Insufficient documentation

## 2015-02-02 DIAGNOSIS — Z203 Contact with and (suspected) exposure to rabies: Secondary | ICD-10-CM | POA: Diagnosis not present

## 2015-02-02 DIAGNOSIS — S60871A Other superficial bite of right wrist, initial encounter: Secondary | ICD-10-CM | POA: Insufficient documentation

## 2015-02-02 DIAGNOSIS — Z2914 Encounter for prophylactic rabies immune globin: Secondary | ICD-10-CM

## 2015-02-02 DIAGNOSIS — S0185XA Open bite of other part of head, initial encounter: Secondary | ICD-10-CM

## 2015-02-02 DIAGNOSIS — F419 Anxiety disorder, unspecified: Secondary | ICD-10-CM | POA: Diagnosis not present

## 2015-02-02 DIAGNOSIS — S60572A Other superficial bite of hand of left hand, initial encounter: Secondary | ICD-10-CM | POA: Insufficient documentation

## 2015-02-02 DIAGNOSIS — Z23 Encounter for immunization: Secondary | ICD-10-CM | POA: Diagnosis not present

## 2015-02-02 DIAGNOSIS — S6992XA Unspecified injury of left wrist, hand and finger(s), initial encounter: Secondary | ICD-10-CM | POA: Diagnosis present

## 2015-02-02 DIAGNOSIS — Y9289 Other specified places as the place of occurrence of the external cause: Secondary | ICD-10-CM | POA: Diagnosis not present

## 2015-02-02 DIAGNOSIS — S0087XA Other superficial bite of other part of head, initial encounter: Secondary | ICD-10-CM | POA: Insufficient documentation

## 2015-02-02 DIAGNOSIS — S61452A Open bite of left hand, initial encounter: Secondary | ICD-10-CM

## 2015-02-02 DIAGNOSIS — Z791 Long term (current) use of non-steroidal anti-inflammatories (NSAID): Secondary | ICD-10-CM | POA: Insufficient documentation

## 2015-02-02 DIAGNOSIS — W540XXA Bitten by dog, initial encounter: Secondary | ICD-10-CM | POA: Diagnosis not present

## 2015-02-02 DIAGNOSIS — S61551A Open bite of right wrist, initial encounter: Secondary | ICD-10-CM

## 2015-02-02 MED ORDER — RABIES VACCINE, PCEC IM SUSR
1.0000 mL | Freq: Once | INTRAMUSCULAR | Status: AC
  Administered 2015-02-02: 1 mL via INTRAMUSCULAR
  Filled 2015-02-02: qty 1

## 2015-02-02 MED ORDER — RABIES IMMUNE GLOBULIN 150 UNIT/ML IM INJ
20.0000 [IU]/kg | INJECTION | Freq: Once | INTRAMUSCULAR | Status: AC
  Administered 2015-02-02: 1800 [IU] via INTRAMUSCULAR
  Filled 2015-02-02: qty 12

## 2015-02-02 NOTE — ED Provider Notes (Signed)
CSN: [id number]     Arrival date & time 02/02/15  1650 History   First MD Initiated Contact with Patient 02/02/15 1716     Chief Complaint  Patient presents with  . Animal Bite     (Consider location/radiation/quality/duration/timing/severity/associated sxs/prior Treatment) HPI   30 year old male with history of anxiety presents for evaluation of dog bite. Patient reports he was bitten by his newly adopted 70-year-old dog. Patient states he was playing fetching with his dog when his dog bit him on his left hand. Patient pickup the dog to give it a spank when the dog bit his face and R wrist.  This happened approximately 4 hrs ago.  He denies any significant pain from the bite. His dog is not up-to-date on shots therefore he is concern of possible rabies.  The dog is currently being quarantine by animal control.  He is UTD with tetanus.  Past Medical History  Diagnosis Date  . Anxiety     Takes Lexapro as needed   Past Surgical History  Procedure Laterality Date  . Neck surgery    . Nasal fracture surgery  2007  . Inguinal hernia repair Bilateral 08/06/2014    Procedure: LAPAROSCOPIC RIGHT AND  LEFT INGUINAL HERNIA REPAIR;  Surgeon: Karie Soda, MD;  Location: [facility];  Service: General;  Laterality: Bilateral;  . Insertion of mesh Bilateral 08/06/2014    Procedure: INSERTION OF MESH;  Surgeon: Karie Soda, MD;  Location: [facility];  Service: General;  Laterality: Bilateral;  . Umbilical hernia repair N/A 08/06/2014    Procedure: HERNIA REPAIR UMBILICAL ADULT;  Surgeon: Karie Soda, MD;  Location: [facility];  Service: General;  Laterality: N/A;   History reviewed. No pertinent family history. Social History  Substance Use Topics  . Smoking status: Former Games developer  . Smokeless tobacco: Current User    Types: Chew  . Alcohol Use: No    Review of Systems  Constitutional: Negative for fever.  Skin: Positive for wound.  Neurological: Negative for numbness.      Allergies  Review of  patient's allergies indicates no known allergies.  Home Medications   Prior to Admission medications   Medication Sig Start Date End Date Taking? Authorizing Provider  acetaminophen (TYLENOL) 325 MG tablet Take 650 mg by mouth every 6 (six) hours as needed for mild pain.   Yes Historical Provider, MD  escitalopram (LEXAPRO) 10 MG tablet Take 10 mg by mouth daily as needed (anxiety).  07/01/14  Yes Historical Provider, MD  HYDROcodone-acetaminophen (NORCO/VICODIN) 5-325 MG per tablet Take 1-2 tablets by mouth every 4 (four) hours as needed for moderate pain or severe pain. 08/06/14  Yes Karie Soda, MD  ibuprofen (ADVIL,MOTRIN) 800 MG tablet Take 0.5-1 tablets (400-800 mg total) by mouth 4 (four) times daily. 08/06/14  Yes Karie Soda, MD   BP 139/78 mmHg  Pulse 79  Temp(Src) 98 F (36.7 C) (Oral)  Resp 16  Ht 6\' 3"  (1.905 m)  Wt 88.451 kg  BMI 24.37 kg/m2  SpO2 99% Physical Exam  Constitutional: He is oriented to person, place, and time. He appears well-developed and well-nourished. No distress.  HENT:  Head: Atraumatic.  Superficial bite marks noted to chin without mucosal involvement.  Not actively bleeding, minimal tenderness  Eyes: Conjunctivae are normal.  Neck: Neck supple.  Neurological: He is alert and oriented to person, place, and time.  Skin: No rash noted.  Superficial bite marks to R wrist and L hand (webspace of 1st-2nd finger)  Psychiatric: He  has a normal mood and affect.  Nursing note and vitals reviewed.   ED Course  Procedures (including critical care time) Labs Review Labs Reviewed - No data to display  Imaging Review No results found. I have personally reviewed and evaluated these images and lab results as part of my medical decision-making.   EKG Interpretation None      MDM   Final diagnoses:  Dog bite of chin, initial encounter  Dog bite, hand, left, initial encounter  Dog bite of wrist, right, initial encounter  Encounter for prophylactic  rabies immune globin    BP 139/78 mmHg  Pulse 79  Temp(Src) 98 F (36.7 C) (Oral)  Resp 16  Ht 6\' 3"  (1.905 m)  Wt 88.451 kg  BMI 24.37 kg/m2  SpO2 99%   5:47 PM Pt was bitten by his 28-year-old dog which appears to be a provoked bite. His dog has not been vaccinated. The dog is currently under quarantine however patient requested for rabies series.  His wound has been irrigated and cleansed appropriately.  He is UTD with tetanus.  Pt will follow through with his rabies series.  Encourage pt to have his dog vaccinated.    Fayrene HelperBowie Keilana Morlock, PA-C 02/02/15 1754  Tilden FossaElizabeth Rees, MD 02/03/15 [phone]

## 2015-02-02 NOTE — ED Notes (Signed)
Pt reports he was bitten by his newly adopted 473year old dog. Dog is not up to date on shots. Pt presents with bites to mouth and bilateral wrist.

## 2015-02-02 NOTE — ED Notes (Signed)
Declined W/C at D/C and was escorted to lobby by RN. 

## 2015-02-02 NOTE — Discharge Instructions (Signed)
Animal Bite °Animal bites can range from mild to serious. An animal bite can result in a scratch on the skin, a deep open cut, a puncture of the skin, a crush injury, or tearing away of the skin or a body part. A small bite from a house pet will usually not cause serious problems. However, some animal bites can become infected or injure a bone or other tissue.  °Bites from certain animals can be more dangerous because of the risk of spreading rabies, which is a serious viral infection. This risk is higher with bites from stray animals or wild animals, such as raccoons, foxes, skunks, and bats. Dogs are responsible for most animal bites. Children are bitten more often than adults. °SYMPTOMS  °Common symptoms of an animal bite include:  °· Pain.   °· Bleeding.   °· Swelling.   °· Bruising.   °DIAGNOSIS  °This condition may be diagnosed based on a physical exam and medical history. Your health care provider will examine the wound and ask for details about the animal and how the bite happened. You may also have tests, such as:  °· Blood tests to check for infection or to determine if surgery is needed. °· X-rays to check for damage to bones or joints. °· Culture test. This uses a sample of fluid from the wound to check for infection. °TREATMENT  °Treatment varies depending on the location and type of animal bite and your medical history. Treatment may include:  °· Wound care. This often includes cleaning the wound, flushing the wound with saline solution, and applying a bandage (dressing). Sometimes, the wound is left open to heal because of the high risk of infection. However, in some cases, the wound may be closed with stitches (sutures), staples, skin glue, or adhesive strips.   °· Antibiotic medicine.   °· Tetanus shot.   °· Rabies treatment if the animal could have rabies.   °In some cases, bites that have become infected may require IV antibiotics and surgical treatment in the hospital.  °HOME CARE  INSTRUCTIONS °Wound Care  °· Follow instructions from your health care provider about how to take care of your wound. Make sure you: °¨ Wash your hands with soap and water before you change your dressing. If soap and water are not available, use hand sanitizer. °¨ Change your dressing as told by your health care provider. °¨ Leave sutures, skin glue, or adhesive strips in place. These skin closures may need to be in place for 2 weeks or longer. If adhesive strip edges start to loosen and curl up, you may trim the loose edges. Do not remove adhesive strips completely unless your health care provider tells you to do that. °· Check your wound every day for signs of infection. Watch for:   °¨  Increasing redness, swelling, or pain.   °¨  Fluid, blood, or pus.   °General Instructions  °· Take or apply over-the-counter and prescription medicines only as told by your health care provider.   °· If you were prescribed an antibiotic, take or apply it as told by your health care provider. Do not stop using the antibiotic even if your condition improves.   °· Keep the injured area raised (elevated) above the level of your heart while you are sitting or lying down, if this is possible.   °· If directed, apply ice to the injured area.   °¨  Put ice in a plastic bag.   °¨  Place a towel between your skin and the bag.   °¨  Leave the ice on for 20 minutes, 2-3 times per day.   °·   Keep all follow-up visits as told by your health care provider. This is important.  SEEK MEDICAL CARE IF:  You have increasing redness, swelling, or pain at the site of your wound.   You have a general feeling of sickness (malaise).   You feel nauseous or you vomit.   You have pain that does not get better.  SEEK IMMEDIATE MEDICAL CARE IF:  You have a red streak extending away from your wound.   You have fluid, blood, or pus coming from your wound.   You have a fever or chills.   You have trouble moving your injured area.   You  have numbness or tingling extending beyond the wound.   This information is not intended to replace advice given to you by your health care provider. Make sure you discuss any questions you have with your health care provider.   Document Released: 09/29/2010 Document Revised: 10/02/2014 Document Reviewed: 05/29/2014 Elsevier Interactive Patient Education 2016 Elsevier Inc.                                   RABIES VACCINE FOLLOW UP  Patient's Name: Chad Patton                     Original Order Date:02/02/2015  Medical Record Number: [id number]  ED Physician: Tilden FossaElizabeth Rees, MD Primary Diagnosis: Rabies Exposure       PCP: REDMON,NOELLE, PA-C  Patient Phone Number: (home) [phone] (home)    (cell)  No relevant phone numbers on file.    (work) There is no work phone number on file. Species of Animal:     You have been seen in the Emergency Department for a possible rabies exposure. It's very important you return for the additional vaccine doses.  Please call the clinic listed below for hours of operation.   Clinic that will administer your rabies vaccines:    DAY 0:  02/02/2015      DAY 3:  02/05/2015       DAY 7:  02/09/2015     DAY 14:  02/16/2015         The 5th vaccine injection is considered for immune compromised patients only.  DAY 28:  03/02/2015

## 2015-02-05 ENCOUNTER — Emergency Department (INDEPENDENT_AMBULATORY_CARE_PROVIDER_SITE_OTHER)
Admission: EM | Admit: 2015-02-05 | Discharge: 2015-02-05 | Disposition: A | Discharge status: Home / Self Care | Payer: BLUE CROSS/BLUE SHIELD | Source: Home / Self Care

## 2015-02-05 ENCOUNTER — Encounter (HOSPITAL_COMMUNITY): Payer: Self-pay | Admitting: *Deleted

## 2015-02-05 DIAGNOSIS — Z203 Contact with and (suspected) exposure to rabies: Secondary | ICD-10-CM | POA: Diagnosis not present

## 2015-02-05 MED ORDER — RABIES VACCINE, PCEC IM SUSR
1.0000 mL | Freq: Once | INTRAMUSCULAR | Status: AC
  Administered 2015-02-05: 1 mL via INTRAMUSCULAR

## 2015-02-05 MED ORDER — RABIES VACCINE, PCEC IM SUSR
INTRAMUSCULAR | Status: AC
  Filled 2015-02-05: qty 1

## 2015-02-05 NOTE — Discharge Instructions (Signed)
Return   As   Directed    For next  In   Series  Of  Injection       Sooner  If  Any  Problems

## 2015-02-05 NOTE — ED Notes (Signed)
Voices  No  Complaints       Here  For  The  Next  In  Series  Of  Injections

## 2015-02-05 NOTE — ED Notes (Signed)
Pt  Here  For    Next  In  Series  Of  Rabies  Injections

## 2015-02-09 ENCOUNTER — Emergency Department (INDEPENDENT_AMBULATORY_CARE_PROVIDER_SITE_OTHER)
Admission: EM | Admit: 2015-02-09 | Discharge: 2015-02-09 | Disposition: A | Discharge status: Home / Self Care | Payer: BLUE CROSS/BLUE SHIELD | Source: Home / Self Care

## 2015-02-09 ENCOUNTER — Encounter (HOSPITAL_COMMUNITY): Payer: Self-pay

## 2015-02-09 DIAGNOSIS — Z203 Contact with and (suspected) exposure to rabies: Secondary | ICD-10-CM | POA: Diagnosis not present

## 2015-02-09 MED ORDER — RABIES VACCINE, PCEC IM SUSR
1.0000 mL | Freq: Once | INTRAMUSCULAR | Status: AC
  Administered 2015-02-09: 1 mL via INTRAMUSCULAR

## 2015-02-09 MED ORDER — RABIES VACCINE, PCEC IM SUSR
INTRAMUSCULAR | Status: AC
  Filled 2015-02-09: qty 1

## 2015-02-09 NOTE — ED Notes (Signed)
Patient here for next injection in the rabbies series 

## 2015-02-16 ENCOUNTER — Emergency Department (INDEPENDENT_AMBULATORY_CARE_PROVIDER_SITE_OTHER)
Admission: EM | Admit: 2015-02-16 | Discharge: 2015-02-16 | Disposition: A | Discharge status: Home / Self Care | Payer: BLUE CROSS/BLUE SHIELD | Source: Home / Self Care

## 2015-02-16 ENCOUNTER — Encounter (HOSPITAL_COMMUNITY): Payer: Self-pay | Admitting: Emergency Medicine

## 2015-02-16 DIAGNOSIS — Z203 Contact with and (suspected) exposure to rabies: Secondary | ICD-10-CM

## 2015-02-16 MED ORDER — RABIES VACCINE, PCEC IM SUSR
1.0000 mL | Freq: Once | INTRAMUSCULAR | Status: AC
  Administered 2015-02-16: 1 mL via INTRAMUSCULAR

## 2015-02-16 MED ORDER — RABIES VACCINE, PCEC IM SUSR
INTRAMUSCULAR | Status: AC
  Filled 2015-02-16: qty 1

## 2015-02-16 NOTE — ED Notes (Signed)
Here for day 14 rabies vaccination (4th) Voices no new concerns A&O x4... No acute distress.

## 2015-02-26 ENCOUNTER — Emergency Department (HOSPITAL_COMMUNITY)
Admission: EM | Admit: 2015-02-26 | Discharge: 2015-02-26 | Discharge status: Absent without leave | Payer: BLUE CROSS/BLUE SHIELD | Source: Home / Self Care

## 2015-05-10 ENCOUNTER — Ambulatory Visit (HOSPITAL_COMMUNITY)
Admission: EM | Admit: 2015-05-10 | Discharge: 2015-05-10 | Disposition: A | Discharge status: Home / Self Care | Payer: BLUE CROSS/BLUE SHIELD | Attending: Family Medicine | Admitting: Family Medicine

## 2015-05-10 ENCOUNTER — Encounter (HOSPITAL_COMMUNITY): Payer: Self-pay | Admitting: Emergency Medicine

## 2015-05-10 DIAGNOSIS — S61412A Laceration without foreign body of left hand, initial encounter: Secondary | ICD-10-CM | POA: Diagnosis not present

## 2015-05-10 MED ORDER — POVIDONE-IODINE 10 % EX SOLN
CUTANEOUS | Status: AC
  Filled 2015-05-10: qty 118

## 2015-05-10 MED ORDER — LIDOCAINE HCL 2 % IJ SOLN
INTRAMUSCULAR | Status: AC
  Filled 2015-05-10: qty 20

## 2015-05-10 MED ORDER — IBUPROFEN 400 MG PO TABS: 400.0000 mg | ORAL_TABLET | Freq: Four times a day (QID) | ORAL | Status: AC | PRN

## 2015-05-10 MED ORDER — IBUPROFEN 800 MG PO TABS: 400.0000 mg | ORAL_TABLET | Freq: Four times a day (QID) | ORAL | Status: AC

## 2015-05-10 NOTE — Discharge Instructions (Signed)
It was nice seeing you. Please keep wound clean and dry. Return in 7 days for suture removal. Laceration Care, Adult A laceration is a cut that goes through all layers of the skin. The cut also goes into the tissue that is right under the skin. Some cuts heal on their own. Others need to be closed with stitches (sutures), staples, skin adhesive strips, or wound glue. Taking care of your cut lowers your risk of infection and helps your cut to heal better. HOW TO TAKE CARE OF YOUR CUT For stitches or staples:  Keep the wound clean and dry.  If you were given a bandage (dressing), you should change it at least one time per day or as told by your doctor. You should also change it if it gets wet or dirty.  Keep the wound completely dry for the first 24 hours or as told by your doctor. After that time, you may take a shower or a bath. However, make sure that the wound is not soaked in water until after the stitches or staples have been removed.  Clean the wound one time each day or as told by your doctor:  Wash the wound with soap and water.  Rinse the wound with water until all of the soap comes off.  Pat the wound dry with a clean towel. Do not rub the wound.  After you clean the wound, put a thin layer of antibiotic ointment on it as told by your doctor. This ointment:  Helps to prevent infection.  Keeps the bandage from sticking to the wound.  Have your stitches or staples removed as told by your doctor. If your doctor used skin adhesive strips:   Keep the wound clean and dry.  If you were given a bandage, you should change it at least one time per day or as told by your doctor. You should also change it if it gets dirty or wet.  Do not get the skin adhesive strips wet. You can take a shower or a bath, but be careful to keep the wound dry.  If the wound gets wet, pat it dry with a clean towel. Do not rub the wound.  Skin adhesive strips fall off on their own. You can trim the  strips as the wound heals. Do not remove any strips that are still stuck to the wound. They will fall off after a while. If your doctor used wound glue:  Try to keep your wound dry, but you may briefly wet it in the shower or bath. Do not soak the wound in water, such as by swimming.  After you take a shower or a bath, gently pat the wound dry with a clean towel. Do not rub the wound.  Do not do any activities that will make you really sweaty until the skin glue has fallen off on its own.  Do not apply liquid, cream, or ointment medicine to your wound while the skin glue is still on.  If you were given a bandage, you should change it at least one time per day or as told by your doctor. You should also change it if it gets dirty or wet.  If a bandage is placed over the wound, do not let the tape for the bandage touch the skin glue.  Do not pick at the glue. The skin glue usually stays on for 5-10 days. Then, it falls off of the skin. General Instructions  To help prevent scarring, make sure to  cover your wound with sunscreen whenever you are outside after stitches are removed, after adhesive strips are removed, or when wound glue stays in place and the wound is healed. Make sure to wear a sunscreen of at least 30 SPF.  Take over-the-counter and prescription medicines only as told by your doctor.  If you were given antibiotic medicine or ointment, take or apply it as told by your doctor. Do not stop using the antibiotic even if your wound is getting better.  Do not scratch or pick at the wound.  Keep all follow-up visits as told by your doctor. This is important.  Check your wound every day for signs of infection. Watch for:  Redness, swelling, or pain.  Fluid, blood, or pus.  Raise (elevate) the injured area above the level of your heart while you are sitting or lying down, if possible. GET HELP IF:  You got a tetanus shot and you have any of these problems at the injection  site:  Swelling.  Very bad pain.  Redness.  Bleeding.  You have a fever.  A wound that was closed breaks open.  You notice a bad smell coming from your wound or your bandage.  You notice something coming out of the wound, such as wood or glass.  Medicine does not help your pain.  You have more redness, swelling, or pain at the site of your wound.  You have fluid, blood, or pus coming from your wound.  You notice a change in the color of your skin near your wound.  You need to change the bandage often because fluid, blood, or pus is coming from the wound.  You start to have a new rash.  You start to have numbness around the wound. GET HELP RIGHT AWAY IF:  You have very bad swelling around the wound.  Your pain suddenly gets worse and is very bad.  You notice painful lumps near the wound or on skin that is anywhere on your body.  You have a red streak going away from your wound.  The wound is on your hand or foot and you cannot move a finger or toe like you usually can.  The wound is on your hand or foot and you notice that your fingers or toes look pale or bluish.   This information is not intended to replace advice given to you by your health care provider. Make sure you discuss any questions you have with your health care provider.   Document Released: 06/30/2007 Document Revised: 05/28/2014 Document Reviewed: 01/07/2014 Elsevier Interactive Patient Education [employer]2016 Elsevier Inc.

## 2015-05-10 NOTE — ED Notes (Signed)
[facility] with a complaint of a laceration to his left thumb with a piece of sheet metal today. The patient stated that his TDAP is up to date.

## 2015-05-10 NOTE — ED Notes (Signed)
Patient's stitches covered with nonadherent dressing and then sterile gauze and wrapped with coban.

## 2015-05-10 NOTE — ED Provider Notes (Signed)
CSN: [id number]     Arrival date & time 05/10/15  1425 History   First MD Initiated Contact with Patient 05/10/15 1641     Chief Complaint  Patient presents with  . Extremity Laceration   (Consider location/radiation/quality/duration/timing/severity/associated sxs/prior Treatment) Patient is a 30 y.o. male presenting with skin laceration. The history is provided by the patient. No language interpreter was used.  Laceration Location:  Hand Hand laceration location:  L hand Length (cm):  1.5 Depth:  Cutaneous Bleeding: controlled   Time since incident:  3 hours Laceration mechanism:  Metal edge (He was looking through is tool box when a metal instrument cut his left hand) Pain details:    Quality:  Burning (6/10 in severity)   Severity:  Moderate   Timing:  Constant   Progression:  Unchanged Foreign body present:  No foreign bodies Relieved by:  Nothing Worsened by:  Movement Ineffective treatments:  None tried Tetanus status:  Up to date (05/29/11)   Past Medical History  Diagnosis Date  . Anxiety     Takes Lexapro as needed   Past Surgical History  Procedure Laterality Date  . Neck surgery    . Nasal fracture surgery  2007  . Inguinal hernia repair Bilateral 08/06/2014    Procedure: LAPAROSCOPIC RIGHT AND  LEFT INGUINAL HERNIA REPAIR;  Surgeon: Karie SodaSteven Gross, MD;  Location: [facility];  Service: General;  Laterality: Bilateral;  . Insertion of mesh Bilateral 08/06/2014    Procedure: INSERTION OF MESH;  Surgeon: Karie SodaSteven Gross, MD;  Location: [facility];  Service: General;  Laterality: Bilateral;  . Umbilical hernia repair N/A 08/06/2014    Procedure: HERNIA REPAIR UMBILICAL ADULT;  Surgeon: Karie SodaSteven Gross, MD;  Location: [facility];  Service: General;  Laterality: N/A;   History reviewed. No pertinent family history. Social History  Substance Use Topics  . Smoking status: Former Games developermoker  . Smokeless tobacco: Current User    Types: Chew  . Alcohol Use: No    Review of Systems    Respiratory: Negative.   Cardiovascular: Negative.   Skin: Positive for wound.       Left hand laceration  All other systems reviewed and are negative.   Allergies  Review of patient's allergies indicates no known allergies.  Home Medications   Prior to Admission medications   Medication Sig Start Date End Date Taking? Authorizing Provider  escitalopram (LEXAPRO) 10 MG tablet Take 10 mg by mouth daily as needed (anxiety).  07/01/14  Yes Historical Provider, MD  acetaminophen (TYLENOL) 325 MG tablet Take 650 mg by mouth every 6 (six) hours as needed for mild pain.    Historical Provider, MD  HYDROcodone-acetaminophen (NORCO/VICODIN) 5-325 MG per tablet Take 1-2 tablets by mouth every 4 (four) hours as needed for moderate pain or severe pain. 08/06/14   Karie SodaSteven Gross, MD  ibuprofen (ADVIL,MOTRIN) 800 MG tablet Take 0.5-1 tablets (400-800 mg total) by mouth 4 (four) times daily. 08/06/14   Karie SodaSteven Gross, MD   Meds Ordered and Administered this Visit  Medications - No data to display  BP 151/72 mmHg  Pulse 61  Temp(Src) 98 F (36.7 C) (Oral)  SpO2 100% No data found.   Physical Exam  Constitutional: He appears well-developed. No distress.  Cardiovascular: Normal rate, regular rhythm and normal heart sounds.  Exam reveals no decreased pulses.   No murmur heard. Pulses:      Radial pulses are 2+ on the right side, and 2+ on the left side.  Pulmonary/Chest: Effort normal  and breath sounds normal. No respiratory distress. He has no wheezes.  Skin:     Nursing note and vitals reviewed.   ED Course  .Marland KitchenLaceration Repair Date/Time: 05/10/2015 4:11 PM Performed by: Doreene Eland Authorized by: Janit Pagan T Consent: Verbal consent obtained. Written consent not obtained. Risks and benefits: risks, benefits and alternatives were discussed Consent given by: patient Patient understanding: patient states understanding of the procedure being performed Patient consent: the  patient's understanding of the procedure matches consent given Procedure consent: procedure consent matches procedure scheduled Site marked: the operative site was marked Patient identity confirmed: verbally with patient Body area: upper extremity Location details: left hand Laceration length: 1.5 cm Tendon involvement: none Nerve involvement: none Vascular damage: no Anesthesia: local infiltration Local anesthetic: lidocaine 2% without epinephrine Anesthetic total: 3 ml Patient sedated: no Irrigation solution: saline Amount of cleaning: standard Debridement: none Degree of undermining: none Skin closure: 4-0 nylon Number of sutures: 2 Technique: simple Approximation: close Approximation difficulty: simple Dressing: pressure dressing Patient tolerance: Patient tolerated the procedure well with no immediate complications   (including critical care time)  Labs Review Labs Reviewed - No data to display  Imaging Review No results found.   Visual Acuity Review  Right Eye Distance:   Left Eye Distance:   Bilateral Distance:    Right Eye Near:   Left Eye Near:    Bilateral Near:         MDM  No diagnosis found. Laceration of hand, left, initial encounter  Wound assessed for foreign body and non was found. He is up to date with his tetanus shot. Suture applied. Wound was cleaned and pressure dressing applied. Patient instructed to keep wound clean and dry. Return for suture removal in 7 days.    Doreene Eland, MD 05/10/15 1730

## 2015-09-09 IMAGING — US US ART/VEN ABD/PELV/SCROTUM DOPPLER LTD
1 series · 14 of 25 positions shown · non-contrast
Comparison: None.

CLINICAL DATA: Patient with bilateral scrotal pain.

EXAM:
SCROTAL ULTRASOUND
DOPPLER ULTRASOUND OF THE TESTICLES
TECHNIQUE: Complete ultrasound examination of the testicles, epididymis, and
other scrotal structures was performed. Color and spectral Doppler
ultrasound were also utilized to evaluate blood flow to the
testicles.

[Series 1: us art/ven abd/pelv/scrotum doppler ltd · 0.07mm/px · 14 of 35 slices shown]
[im 1/35]
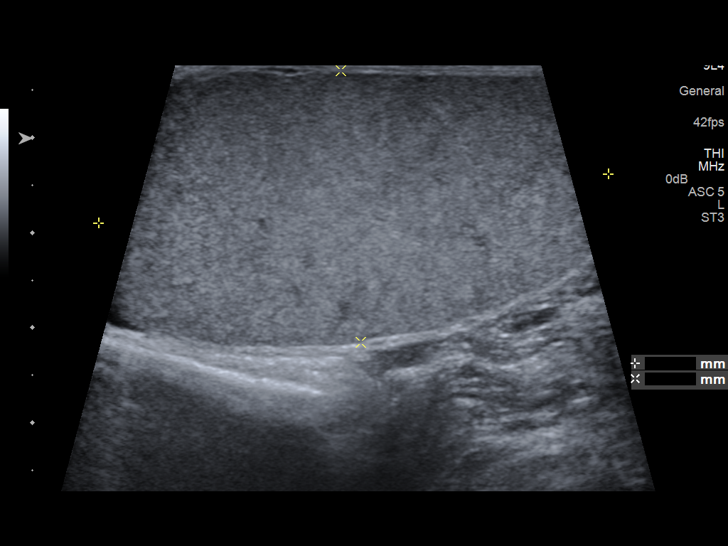
[im 3/35]
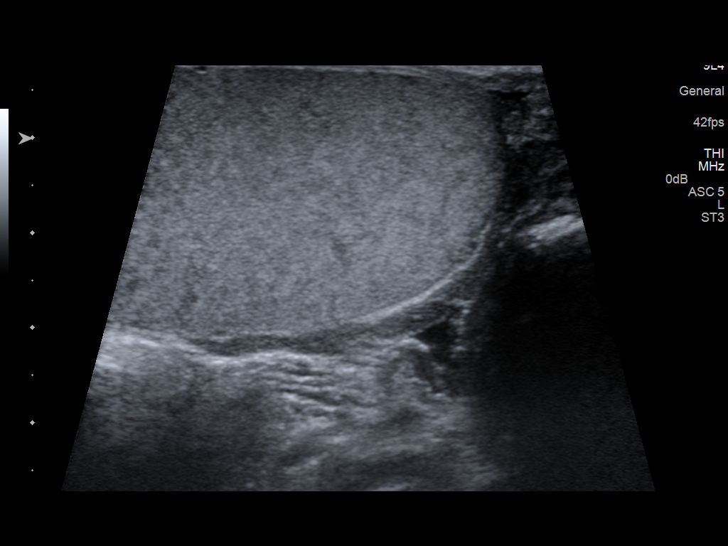
[im 6/35]
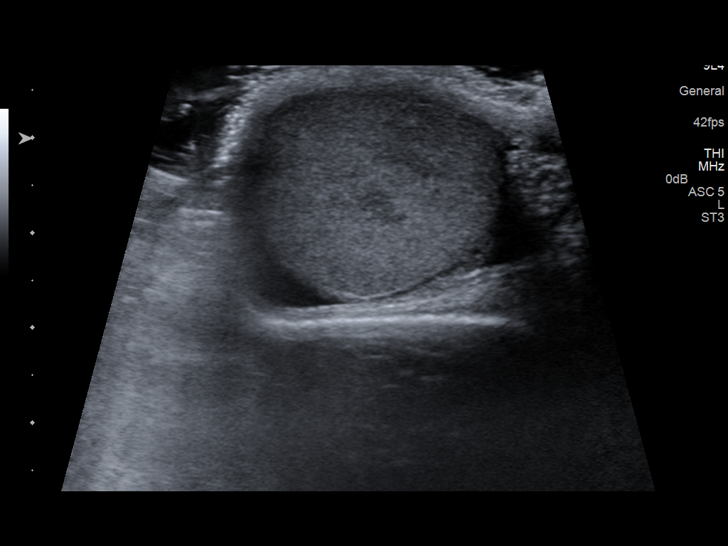
[im 9/35]
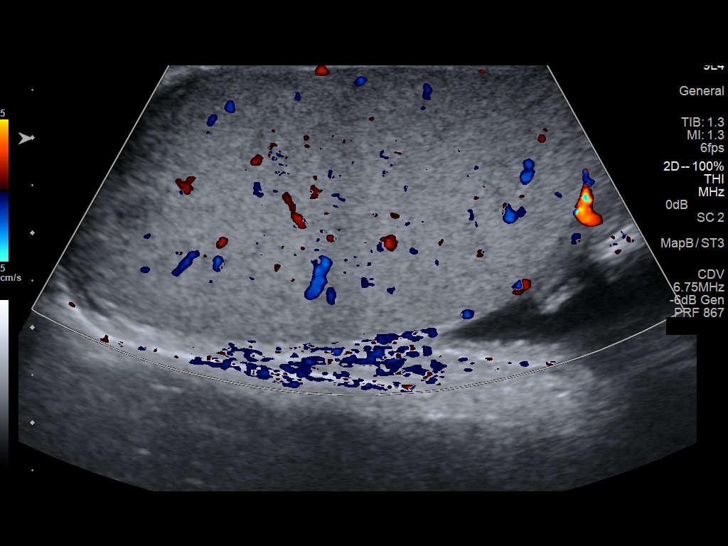
[im 12/35]
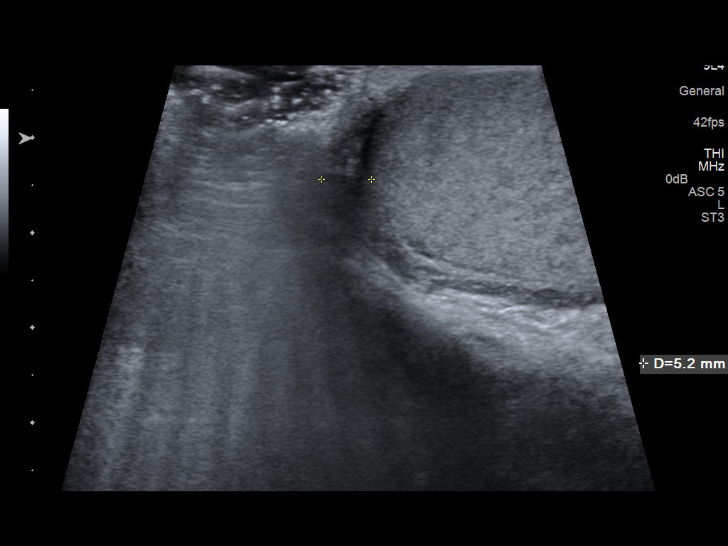
[im 13/35]
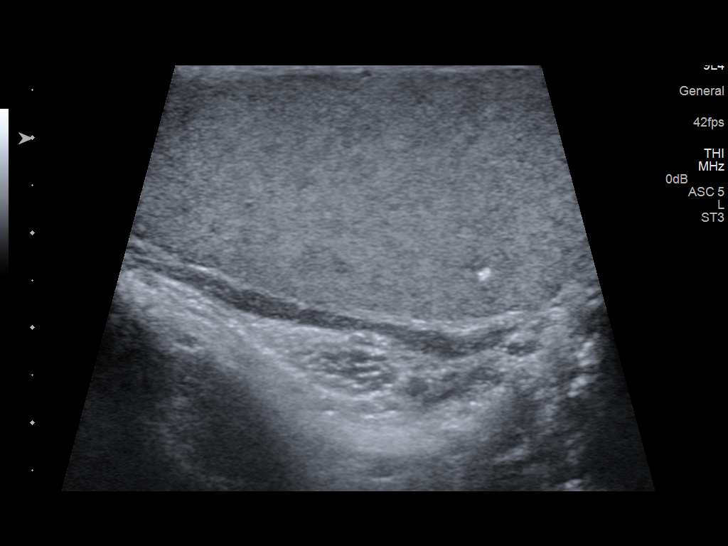
[im 16/35]
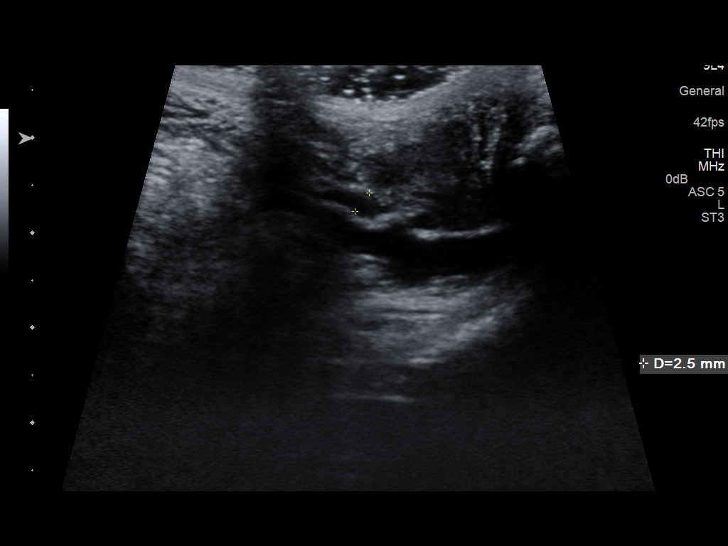
[im 19/35]
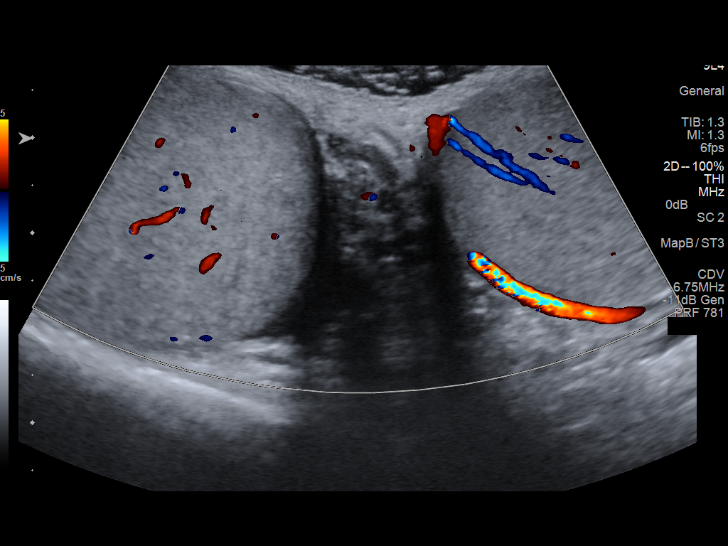
[im 22/35]
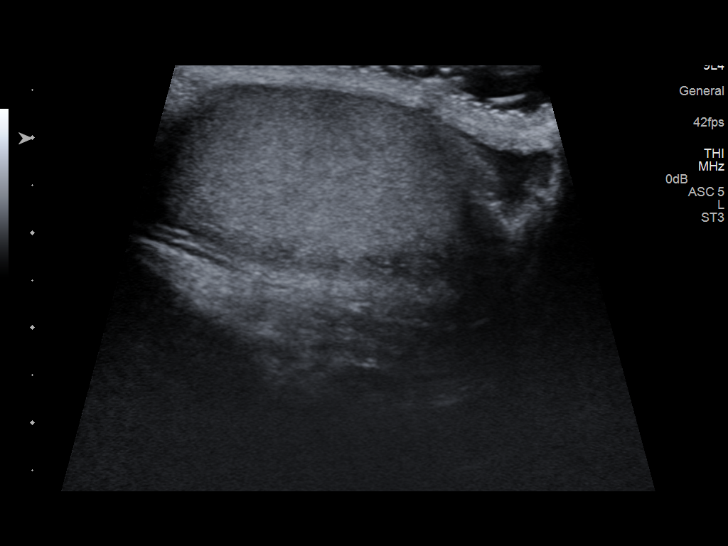
[im 23/35]
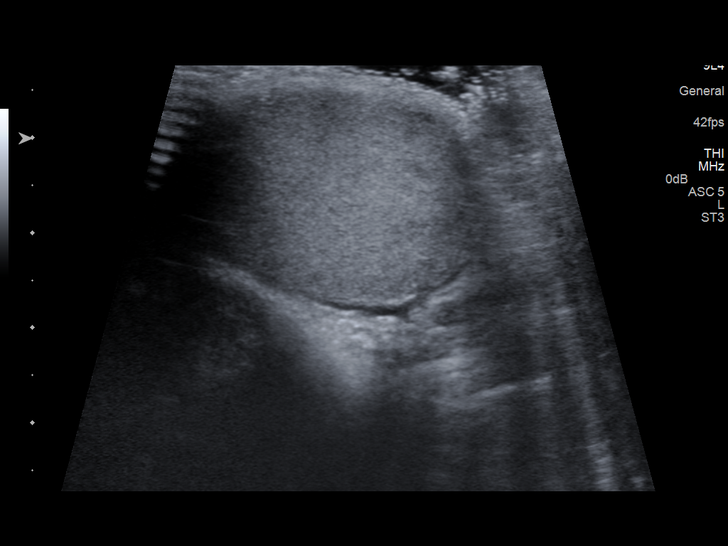
[im 26/35]
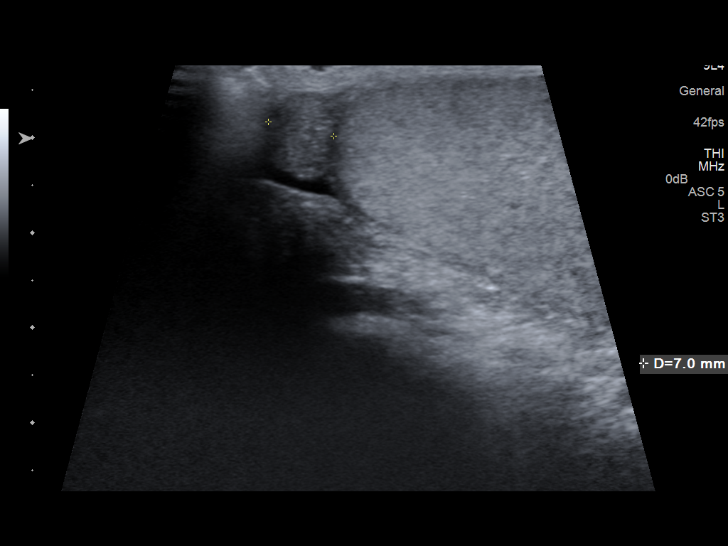
[im 29/35]
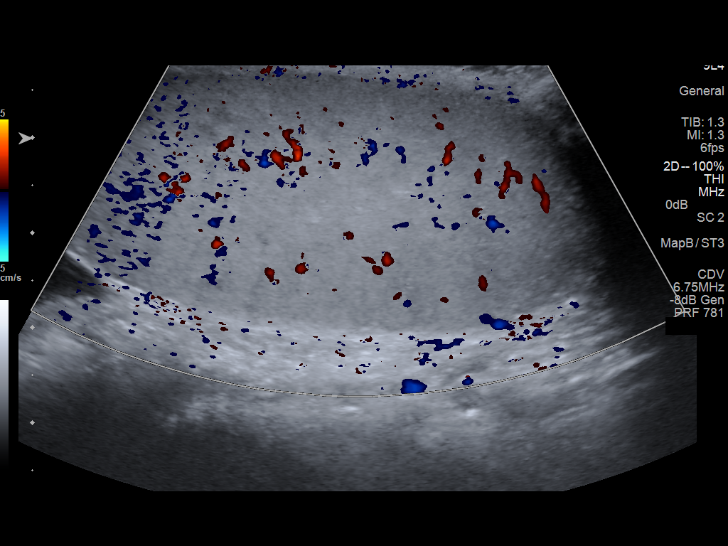
[im 32/35]
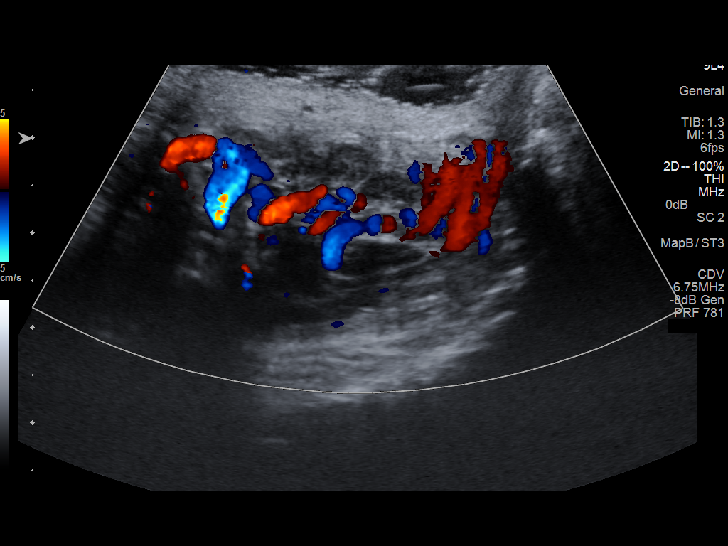
[im 35/35]
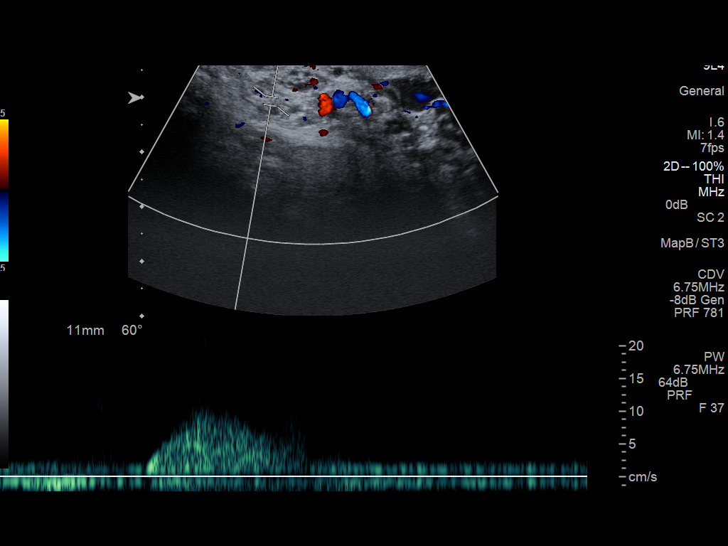

[14 of 25 positions shown; findings below may reference images not displayed]

FINDINGS: Right testicle

Measurements: 5.4 x 2.9 x 3.6 cm. No mass or microlithiasis
visualized.

Left testicle

Measurements: 5.5 x 3.0 x 3.0 cm. No mass or microlithiasis
visualized.

Right epididymis:  Normal in size and appearance.

Left epididymis:  Normal in size and appearance.

Hydrocele:  Small right hydrocele.

Varicocele:  Small bilateral varicoceles.

Pulsed Doppler interrogation of both testes demonstrates normal low
resistance arterial and venous waveforms bilaterally.
IMPRESSION: Normal sonographic appearance to the testicles bilaterally without
evidence for torsion.

## 2017-03-14 IMAGING — CT CT ABD-PELV W/ CM
2 of 4 series · 10 of 46 positions shown, 11 images · IV contrast (Iodine)
Comparison: Testicular ultrasound earlier same day.

CLINICAL DATA: Patient with right lower quadrant pain radiating to
the umbilicus.

EXAM:
CT ABDOMEN AND PELVIS WITH CONTRAST
TECHNIQUE: Multidetector CT imaging of the abdomen and pelvis was performed
using the standard protocol following bolus administration of
intravenous contrast.
CONTRAST:  100mL OMNIPAQUE IOHEXOL 300 MG/ML  SOLN

[Series 201: routine, idose (2) · axial · 0.78mm/px · z∈[+69,+524]mm · 7 of 111 slices shown, 8 images]
[im 10/111  soft-tissue]
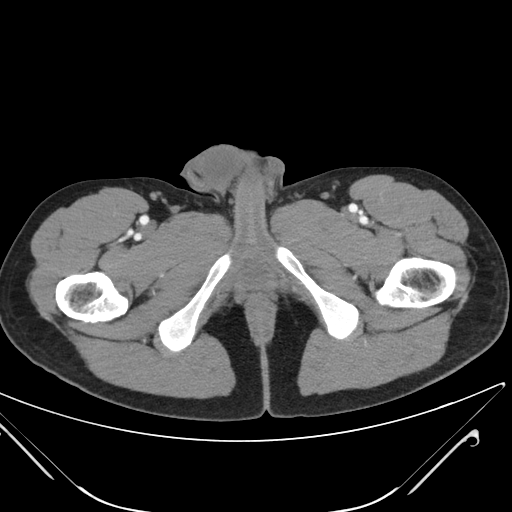
[im 10/111  bone]
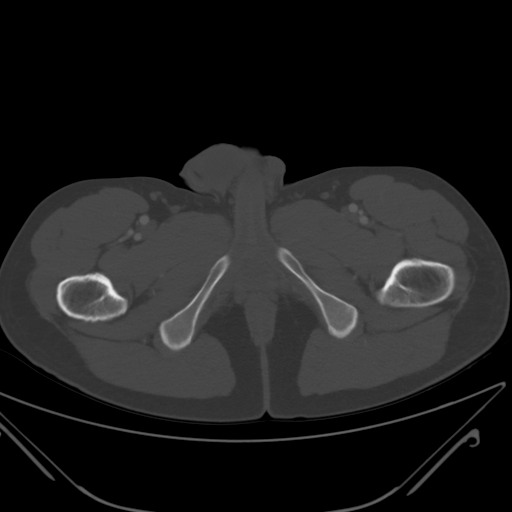
[im 24/111  soft-tissue]
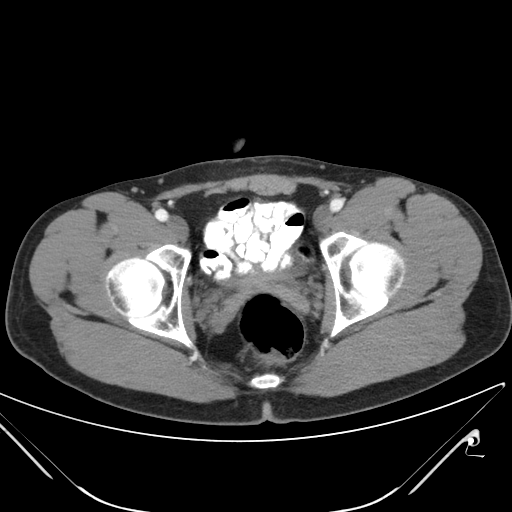
[im 39/111  soft-tissue]
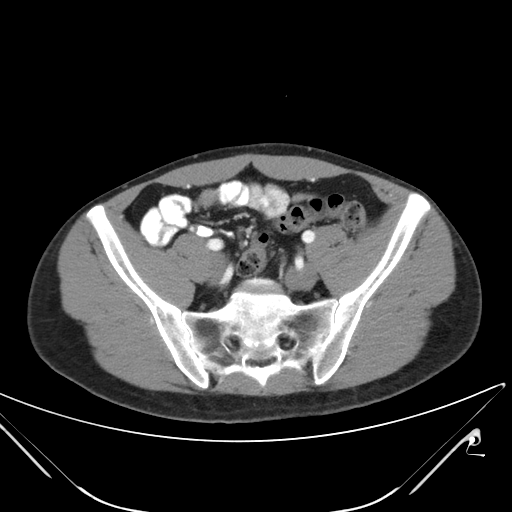
[im 58/111  soft-tissue]
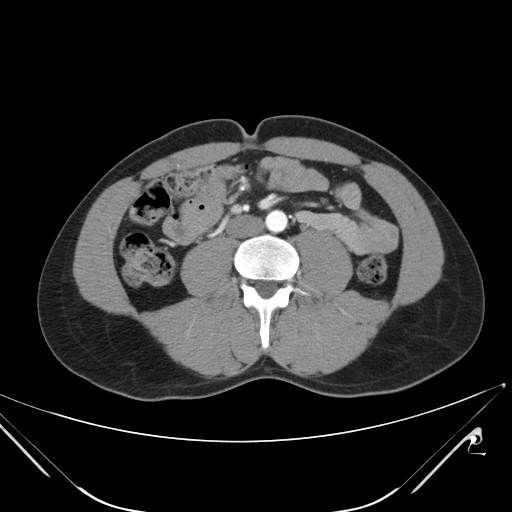
[im 72/111  soft-tissue]
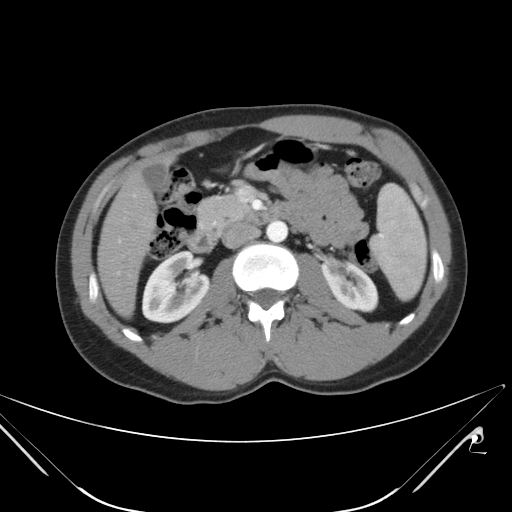
[im 87/111  soft-tissue]
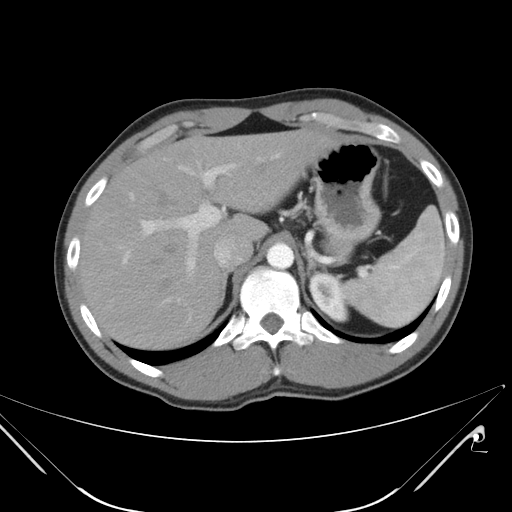
[im 101/111  soft-tissue]
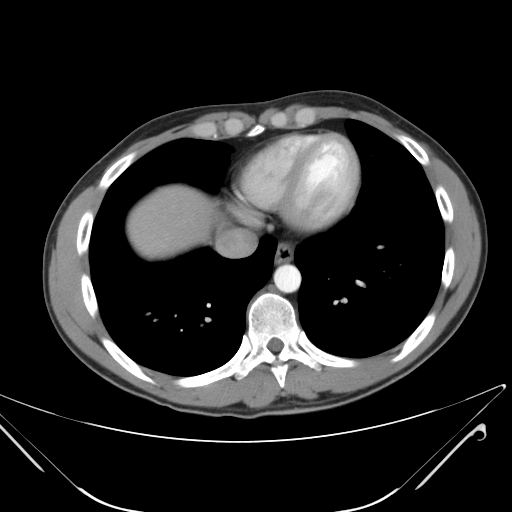

[Series 203: coronals, idose (2) · coronal · 0.45mm/px · 3 of 110 slices shown]
[im 37/110  soft-tissue]
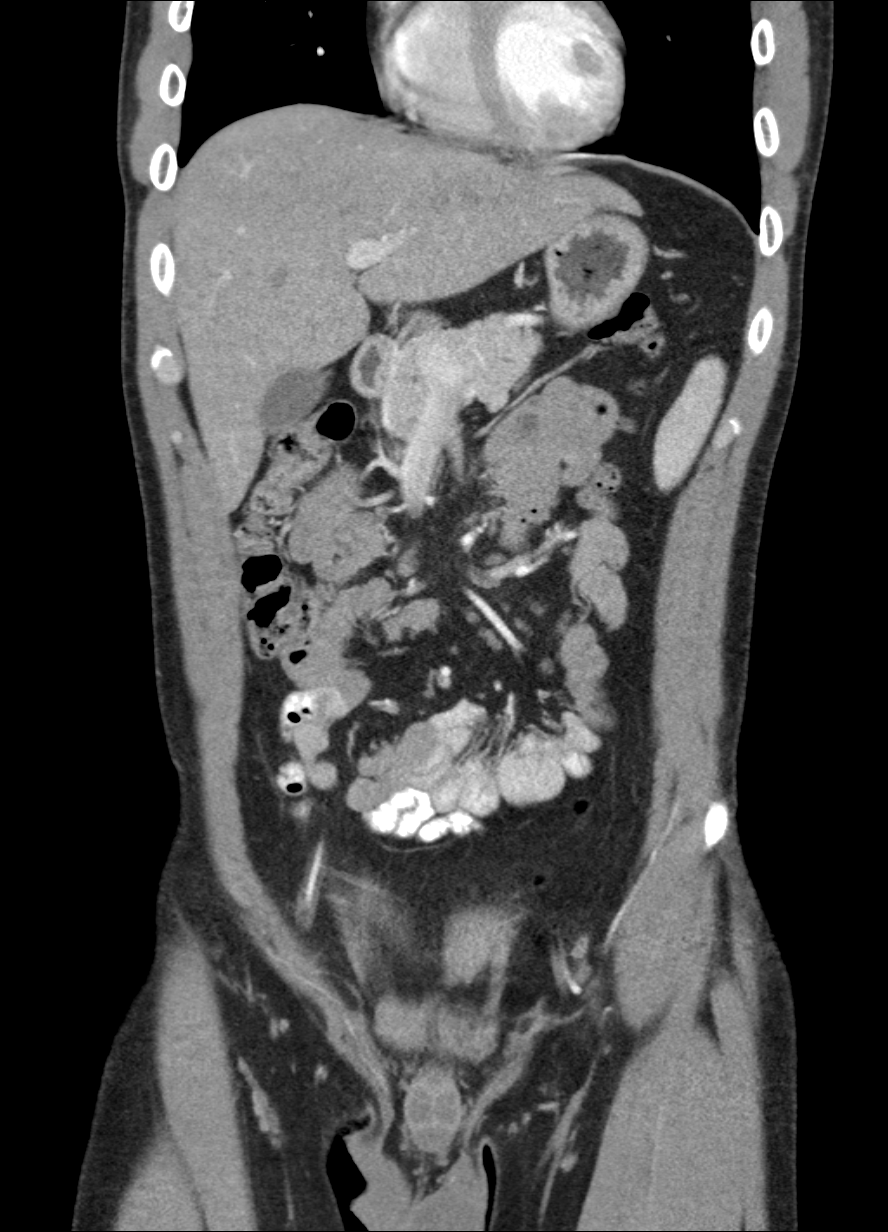
[im 49/110  soft-tissue]
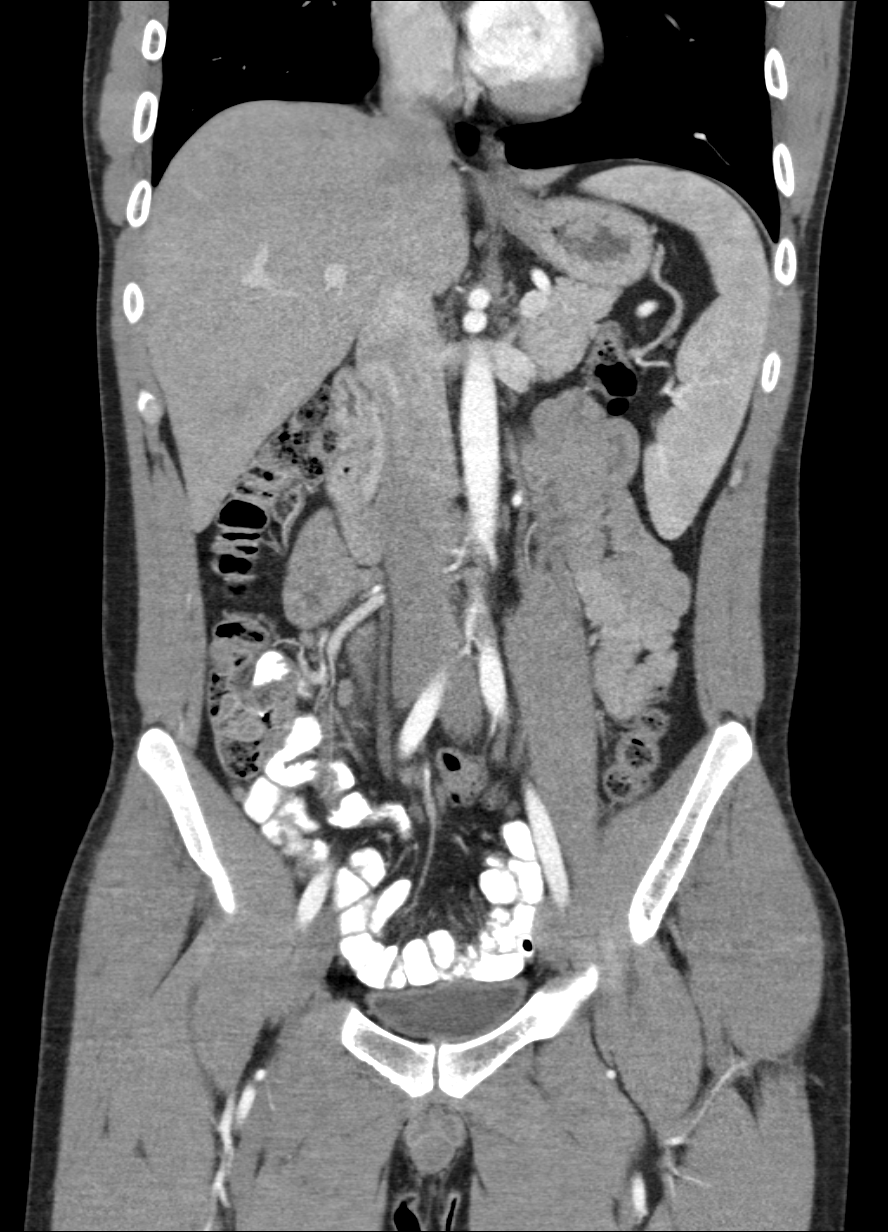
[im 61/110  soft-tissue]
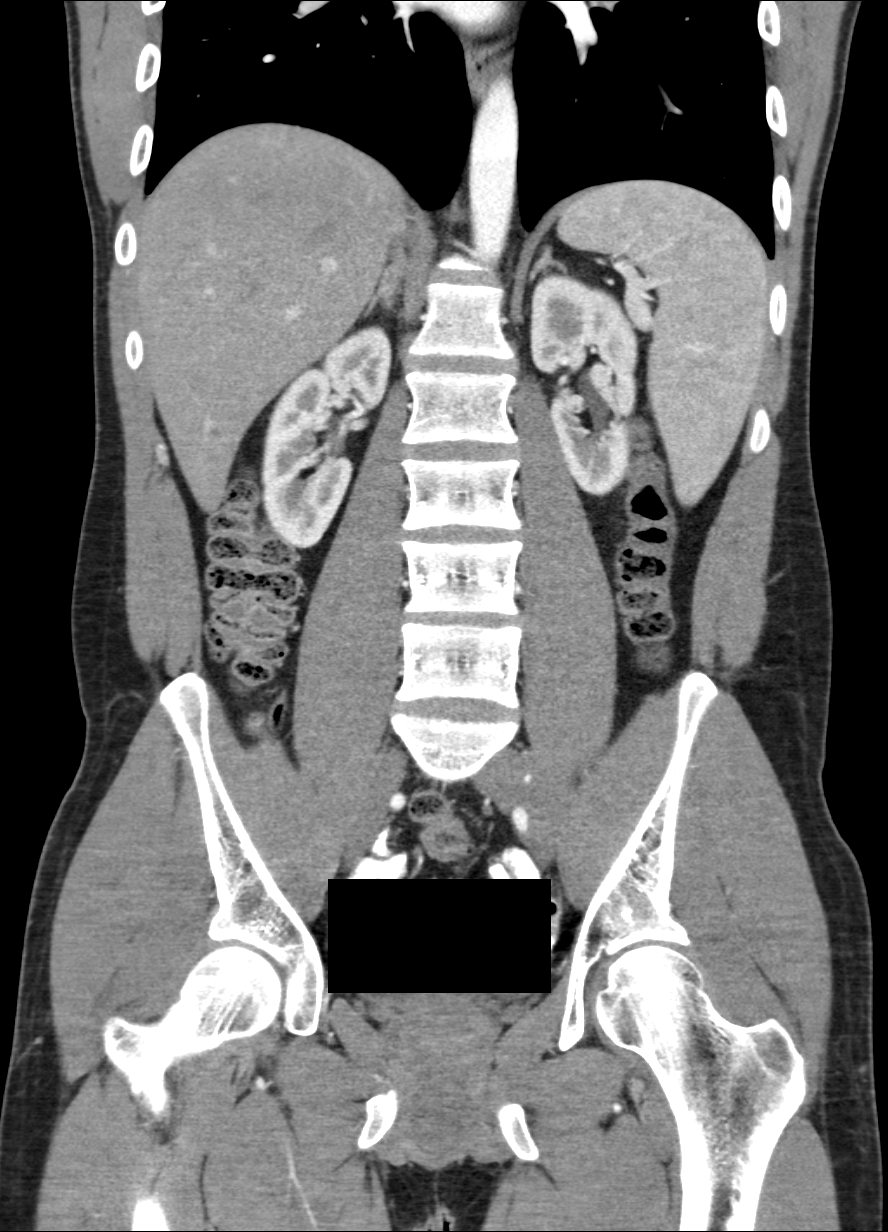

[10 of 46 positions shown; findings below may reference images not displayed]

FINDINGS: Lower chest: Normal heart size. No consolidative or nodular
pulmonary opacities. No pleural effusion.

Hepatobiliary: Liver is normal in size and contour without focal
hepatic lesion identified. Gallbladder is unremarkable.

Pancreas: Unremarkable

Spleen: Unremarkable

Adrenals/Urinary Tract: Normal adrenal glands. Kidneys enhance
symmetrically with contrast. Urinary bladder is unremarkable. No
hydronephrosis.

Stomach/Bowel: No abnormal bowel wall thickening or evidence for
bowel obstruction. Normal appendix. No free fluid or free
intraperitoneal air.

Vascular/Lymphatic: Normal caliber abdominal aorta. No
retroperitoneal lymphadenopathy.

Other: None

Musculoskeletal: No aggressive or acute appearing osseous lesions.
IMPRESSION: Normal appendix.  No acute process within the abdomen or pelvis.
